# Patient Record
Sex: Male | Born: 1955
Health system: Southern US, Community
[De-identification: ages and names within clinical notes are randomized; demographics above are authoritative.]

## PROBLEM LIST (undated history)

## (undated) DIAGNOSIS — C44211 Basal cell carcinoma of skin of unspecified ear and external auricular canal: Secondary | ICD-10-CM

## (undated) DIAGNOSIS — Z87442 Personal history of urinary calculi: Secondary | ICD-10-CM

## (undated) DIAGNOSIS — J189 Pneumonia, unspecified organism: Secondary | ICD-10-CM

## (undated) DIAGNOSIS — E785 Hyperlipidemia, unspecified: Secondary | ICD-10-CM

## (undated) DIAGNOSIS — Z951 Presence of aortocoronary bypass graft: Secondary | ICD-10-CM

## (undated) DIAGNOSIS — I2510 Atherosclerotic heart disease of native coronary artery without angina pectoris: Secondary | ICD-10-CM

## (undated) HISTORY — DX: Basal cell carcinoma of skin of unspecified ear and external auricular canal: C44.211

## (undated) HISTORY — PX: CATARACT EXTRACTION: SUR2

## (undated) HISTORY — DX: Atherosclerotic heart disease of native coronary artery without angina pectoris: I25.10

## (undated) HISTORY — PX: HYDROCELE EXCISION: SHX482

## (undated) HISTORY — PX: MANDIBLE SURGERY: SHX707

## (undated) HISTORY — DX: Hyperlipidemia, unspecified: E78.5

## (undated) HISTORY — DX: Presence of aortocoronary bypass graft: Z95.1

---

## 1999-11-14 ENCOUNTER — Ambulatory Visit (HOSPITAL_BASED_OUTPATIENT_CLINIC_OR_DEPARTMENT_OTHER): Admission: RE | Admit: 1999-11-14 | Discharge: 1999-11-14 | Payer: Self-pay

## 2003-09-15 ENCOUNTER — Encounter: Admission: RE | Admit: 2003-09-15 | Discharge: 2003-09-15 | Payer: Self-pay | Admitting: Family Medicine

## 2012-07-13 ENCOUNTER — Other Ambulatory Visit: Payer: Self-pay | Admitting: Family Medicine

## 2012-07-13 DIAGNOSIS — G4482 Headache associated with sexual activity: Secondary | ICD-10-CM

## 2012-07-20 ENCOUNTER — Inpatient Hospital Stay: Admission: RE | Admit: 2012-07-20 | Payer: Self-pay | Source: Ambulatory Visit

## 2012-07-20 ENCOUNTER — Other Ambulatory Visit: Payer: Self-pay

## 2014-08-10 ENCOUNTER — Ambulatory Visit: Payer: Self-pay | Admitting: Cardiology

## 2014-08-24 ENCOUNTER — Encounter: Payer: Self-pay | Admitting: Cardiology

## 2015-03-28 ENCOUNTER — Encounter: Payer: Self-pay | Admitting: Cardiovascular Disease

## 2015-03-28 ENCOUNTER — Encounter: Payer: Self-pay | Admitting: Nurse Practitioner

## 2015-03-28 ENCOUNTER — Ambulatory Visit (INDEPENDENT_AMBULATORY_CARE_PROVIDER_SITE_OTHER): Payer: BLUE CROSS/BLUE SHIELD | Admitting: Cardiovascular Disease

## 2015-03-28 VITALS — BP 146/80 | HR 69 | Ht 73.0 in | Wt 206.0 lb

## 2015-03-28 DIAGNOSIS — I200 Unstable angina: Secondary | ICD-10-CM | POA: Diagnosis not present

## 2015-03-28 DIAGNOSIS — I209 Angina pectoris, unspecified: Secondary | ICD-10-CM | POA: Insufficient documentation

## 2015-03-28 DIAGNOSIS — I10 Essential (primary) hypertension: Secondary | ICD-10-CM | POA: Diagnosis not present

## 2015-03-28 DIAGNOSIS — E785 Hyperlipidemia, unspecified: Secondary | ICD-10-CM | POA: Diagnosis not present

## 2015-03-28 LAB — LIPID PANEL
CHOL/HDL RATIO: 5.6 ratio — AB (ref <=5.0)
CHOLESTEROL: 258 mg/dL — AB (ref 125–200)
HDL: 46 mg/dL (ref >=40)
LDL Cholesterol: 173 mg/dL — ABNORMAL HIGH (ref <130)
TRIGLYCERIDES: 195 mg/dL — AB (ref <150)
VLDL: 39 mg/dL — AB (ref <30)

## 2015-03-28 LAB — CBC WITH DIFFERENTIAL/PLATELET
Basophils Absolute: 0.1 10*3/uL (ref 0.0–0.1)
Basophils Relative: 1 % (ref 0–1)
Eosinophils Absolute: 0.1 10*3/uL (ref 0.0–0.7)
Eosinophils Relative: 2 % (ref 0–5)
HEMATOCRIT: 44.2 % (ref 39.0–52.0)
HEMOGLOBIN: 15.7 g/dL (ref 13.0–17.0)
LYMPHS PCT: 35 % (ref 12–46)
Lymphs Abs: 2.1 10*3/uL (ref 0.7–4.0)
MCH: 31.6 pg (ref 26.0–34.0)
MCHC: 35.5 g/dL (ref 30.0–36.0)
MCV: 88.9 fL (ref 78.0–100.0)
MONO ABS: 0.4 10*3/uL (ref 0.1–1.0)
MONOS PCT: 6 % (ref 3–12)
MPV: 10.3 fL (ref 8.6–12.4)
NEUTROS ABS: 3.4 10*3/uL (ref 1.7–7.7)
NEUTROS PCT: 56 % (ref 43–77)
Platelets: 181 10*3/uL (ref 150–400)
RBC: 4.97 MIL/uL (ref 4.22–5.81)
RDW: 13 % (ref 11.5–15.5)
WBC: 6.1 10*3/uL (ref 4.0–10.5)

## 2015-03-28 LAB — COMPREHENSIVE METABOLIC PANEL
ALBUMIN: 4.5 g/dL (ref 3.6–5.1)
ALK PHOS: 57 U/L (ref 40–115)
ALT: 15 U/L (ref 9–46)
AST: 14 U/L (ref 10–35)
BUN: 18 mg/dL (ref 7–25)
CALCIUM: 9.8 mg/dL (ref 8.6–10.3)
CO2: 27 mmol/L (ref 20–31)
Chloride: 101 mmol/L (ref 98–110)
Creat: 1.18 mg/dL (ref 0.70–1.33)
Glucose, Bld: 89 mg/dL (ref 65–99)
Potassium: 4.2 mmol/L (ref 3.5–5.3)
Sodium: 137 mmol/L (ref 135–146)
TOTAL PROTEIN: 7.4 g/dL (ref 6.1–8.1)
Total Bilirubin: 0.5 mg/dL (ref 0.2–1.2)

## 2015-03-28 MED ORDER — LOSARTAN POTASSIUM 50 MG PO TABS: 50.0000 mg | ORAL_TABLET | Freq: Every day | ORAL | Status: DC

## 2015-03-28 MED ORDER — ASPIRIN EC 81 MG PO TBEC: 81.0000 mg | DELAYED_RELEASE_TABLET | Freq: Every day | ORAL | Status: DC

## 2015-03-28 MED ORDER — NITROGLYCERIN 0.4 MG SL SUBL: 0.4000 mg | SUBLINGUAL_TABLET | SUBLINGUAL | Status: DC | PRN

## 2015-03-28 NOTE — Progress Notes (Signed)
Cardiology Office Note   Date:  03/28/2015   ID:  DIRON VONDERHEIDE, DOB February 14, 1956, MRN [id number]  PCP:  Simona Huh, MD  Cardiologist:   Thayer Headings, MD   Chief Complaint  Patient presents with  . Hyperlipidemia   Problem list 1. Hyperlipidemia   History of Present Illness: Kenneth Vance is a 59 y.o. male who presents for hyperlipidemia Has tried Zocor and Lipitor - had severe muscle aches. Also tried Livilo - same  Has not tried crestor .   Does not recall trying zetia  Recently has developed chest pain with any exertion. Has been walking. Wanted to work up to running again but cannot work up to running .  Develops mid sternal chest pain with brisk walking.  Pressure / tightness like feeling .    Resolves about 10-15 seconds after stopping  Occurs every time he walks quickly .  Had chest pain walking in from the parking lot today   Walks slowly without any difficulty .    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   Working in [employer].     Past Medical History  Diagnosis Date  . BCC (basal cell carcinoma), ear   . Hyperlipidemia     Past Surgical History  Procedure Laterality Date  . Mandible surgery    . Hydrocele excision    . Cataract extraction Left      Current Outpatient Prescriptions  Medication Sig Dispense Refill  . diazepam (VALIUM) 5 MG tablet Take 5 mg by mouth at bedtime as needed.   0  . ibuprofen (ADVIL,MOTRIN) 800 MG tablet Take 800 mg by mouth as needed for mild pain.   0   No current facility-administered medications for this visit.    Allergies:   Peanut-containing drug products; Fenofibrate; Lipitor; Livalo; Niacin; Niacin and related; Peanut oil; and Zocor    Social History:  The patient  reports that he has never smoked. He does not have any smokeless tobacco history on file. He reports that he drinks alcohol. He reports that he does not use illicit drugs.   Family History:  The  patient's family history includes Hyperlipidemia in his mother; Pulmonary fibrosis in his father.    ROS:  Please see the history of present illness.    Review of Systems: Constitutional:  denies fever, chills, diaphoresis, appetite change and fatigue.  HEENT: denies photophobia, eye pain, redness, hearing loss, ear pain, congestion, sore throat, rhinorrhea, sneezing, neck pain, neck stiffness and tinnitus.  Respiratory: denies SOB, DOE, cough, chest tightness, and wheezing.  Cardiovascular: admits to chest pain with exertion   Gastrointestinal: denies nausea, vomiting, abdominal pain, diarrhea, constipation, blood in stool.  Genitourinary: denies dysuria, urgency, frequency, hematuria, flank pain and difficulty urinating.  Musculoskeletal: denies  myalgias, back pain, joint swelling, arthralgias and gait problem.   Skin: denies pallor, rash and wound.  Neurological: denies dizziness, seizures, syncope, weakness, light-headedness, numbness and headaches.   Hematological: denies adenopathy, easy bruising, personal or family bleeding history.  Psychiatric/ Behavioral: denies suicidal ideation, mood changes, confusion, nervousness, sleep disturbance and agitation.       All other systems are reviewed and negative.    PHYSICAL EXAM: VS:  BP 146/80 mmHg  Pulse 69  Ht 6\' 1"  (1.854 m)  Wt 206 lb (93.441 kg)  BMI 27.18 kg/m2 , BMI Body mass index is 27.18 kg/(m^2). GEN: Well nourished, well developed, in no acute  distress HEENT: normal Neck: no JVD, carotid bruits, or masses Cardiac: RRR; no murmurs, rubs, or gallops,no edema  Respiratory:  clear to auscultation bilaterally, normal work of breathing GI: soft, nontender, nondistended, + BS MS: no deformity or atrophy Skin: warm and dry, no rash Neuro:  Strength and sensation are intact Psych: normal   EKG:  EKG is ordered today. The ekg ordered today demonstrates  NSR at 69.   Inc. RBBB   Recent Labs: No results found for  requested labs within last 365 days.    Lipid Panel No results found for: CHOL, TRIG, HDL, CHOLHDL, VLDL, LDLCALC, LDLDIRECT    Wt Readings from Last 3 Encounters:  03/28/15 206 lb (93.441 kg)      Other studies Reviewed: Additional studies/ records that were reviewed today include: . Review of the above records demonstrates:    ASSESSMENT AND PLAN:  1.  Unstable angina: Has predictable angina every time he walks at any CP. He can walk very slowly for several miles but deftly has chest tightness and pressure if he walks up an incline or picks up his paced. He's previously been very healthy and played in an adult soccer league until age 89.  His symptoms are completely consistent with unstable angina. We'll start him on aspirin once a day. We'll give him a prescription for nitroglycerin and I'll start him on losartan 50 mg a day. We'll schedule him for a cardiac catheterization and possible antiplastic in the next several days.  2. Essential hypertension: His blood pressure is mildly elevated. He eats a fairly good diet. We'll start him on losartan 50 mg a day.  3. Hyperlipidemia: He has a very strong family history of hyperlipidemia and has a family history of coronary artery disease. He's been intolerant to statins. We'll refer him to lipid clinic. I suspect that he'll need to be started on one of the injectable cholesterol medications.   Current medicines are reviewed at length with the patient today.  The patient does not have concerns regarding medicines.  The following changes have been made:  no change  Labs/ tests ordered today include:  No orders of the defined types were placed in this encounter.     Disposition:   FU with me 2 months .      Nahser, Wonda Cheng, MD  03/28/2015 9:23 AM    [address] [place], [place], [place]  [address] Phone: [phone]; Fax: [phone]   [facility]  [address] [place], [place]  [address] [phone]   Fax [phone]

## 2015-03-28 NOTE — Patient Instructions (Addendum)
Medication Instructions:  START Aspirin 81 mg once daily START Losartan 50 mg once daily TAKE Nitroglycerin 0.4 mg as needed for chest pain - place 1 pill under tongue, if pain persists may repeat 2 more times @ 5 minutes apart.  Do not exceed 3 tabs in one episode  Labwork: TODAY - cholesterol, basic metabolic panel, liver, pt/inr, CBC  Testing/Procedures: Your physician has requested that you have a cardiac catheterization. Cardiac catheterization is used to diagnose and/or treat various heart conditions. Doctors may recommend this procedure for a number of different reasons. The most common reason is to evaluate chest pain. Chest pain can be a symptom of coronary artery disease (CAD), and cardiac catheterization can show whether plaque is narrowing or blocking your heart's arteries. This procedure is also used to evaluate the valves, as well as measure the blood flow and oxygen levels in different parts of your heart. For further information please visit [email or web address]. Please follow instruction sheet, as given.    Follow-Up: You have been referred to Lipid Clinic for management of your high cholesterol   Your physician recommends that you schedule a follow-up appointment in: 2 months with Dr. Acie Fredrickson.    If you need a refill on your cardiac medications before your next appointment, please call your pharmacy.   Thank you for choosing [facility]! Christen Bame, RN [phone]

## 2015-03-29 ENCOUNTER — Encounter (HOSPITAL_COMMUNITY)
Admission: AD | Disposition: A | Discharge status: Home / Self Care | Payer: Self-pay | Source: Ambulatory Visit | Attending: Surgery

## 2015-03-29 ENCOUNTER — Encounter (HOSPITAL_COMMUNITY): Payer: Self-pay | Admitting: *Deleted

## 2015-03-29 ENCOUNTER — Inpatient Hospital Stay (HOSPITAL_COMMUNITY)
Admission: AD | Admit: 2015-03-29 | Discharge: 2015-04-05 | DRG: 234 | Disposition: A | Discharge status: Home / Self Care | Payer: BLUE CROSS/BLUE SHIELD | Source: Ambulatory Visit | Attending: Surgery | Admitting: Surgery

## 2015-03-29 ENCOUNTER — Other Ambulatory Visit: Payer: Self-pay | Admitting: *Deleted

## 2015-03-29 DIAGNOSIS — Z951 Presence of aortocoronary bypass graft: Secondary | ICD-10-CM

## 2015-03-29 DIAGNOSIS — D6959 Other secondary thrombocytopenia: Secondary | ICD-10-CM | POA: Diagnosis not present

## 2015-03-29 DIAGNOSIS — E8770 Fluid overload, unspecified: Secondary | ICD-10-CM | POA: Diagnosis not present

## 2015-03-29 DIAGNOSIS — I25119 Atherosclerotic heart disease of native coronary artery with unspecified angina pectoris: Secondary | ICD-10-CM

## 2015-03-29 DIAGNOSIS — Z79899 Other long term (current) drug therapy: Secondary | ICD-10-CM

## 2015-03-29 DIAGNOSIS — I25110 Atherosclerotic heart disease of native coronary artery with unstable angina pectoris: Principal | ICD-10-CM | POA: Diagnosis present

## 2015-03-29 DIAGNOSIS — I10 Essential (primary) hypertension: Secondary | ICD-10-CM | POA: Diagnosis present

## 2015-03-29 DIAGNOSIS — I2510 Atherosclerotic heart disease of native coronary artery without angina pectoris: Secondary | ICD-10-CM

## 2015-03-29 DIAGNOSIS — Z7982 Long term (current) use of aspirin: Secondary | ICD-10-CM

## 2015-03-29 DIAGNOSIS — K5900 Constipation, unspecified: Secondary | ICD-10-CM | POA: Diagnosis not present

## 2015-03-29 DIAGNOSIS — Z8249 Family history of ischemic heart disease and other diseases of the circulatory system: Secondary | ICD-10-CM | POA: Diagnosis not present

## 2015-03-29 DIAGNOSIS — J9811 Atelectasis: Secondary | ICD-10-CM

## 2015-03-29 DIAGNOSIS — Z85828 Personal history of other malignant neoplasm of skin: Secondary | ICD-10-CM | POA: Diagnosis not present

## 2015-03-29 DIAGNOSIS — E785 Hyperlipidemia, unspecified: Secondary | ICD-10-CM | POA: Diagnosis present

## 2015-03-29 DIAGNOSIS — R079 Chest pain, unspecified: Secondary | ICD-10-CM | POA: Diagnosis present

## 2015-03-29 DIAGNOSIS — I200 Unstable angina: Secondary | ICD-10-CM

## 2015-03-29 DIAGNOSIS — I209 Angina pectoris, unspecified: Secondary | ICD-10-CM | POA: Diagnosis not present

## 2015-03-29 HISTORY — PX: CARDIAC CATHETERIZATION: SHX172

## 2015-03-29 HISTORY — DX: Atherosclerotic heart disease of native coronary artery without angina pectoris: I25.10

## 2015-03-29 LAB — CBC
HEMATOCRIT: 42.1 % (ref 39.0–52.0)
Hemoglobin: 14.2 g/dL (ref 13.0–17.0)
MCH: 30.1 pg (ref 26.0–34.0)
MCHC: 33.7 g/dL (ref 30.0–36.0)
MCV: 89.4 fL (ref 78.0–100.0)
PLATELETS: 160 10*3/uL (ref 150–400)
RBC: 4.71 MIL/uL (ref 4.22–5.81)
RDW: 12.4 % (ref 11.5–15.5)
WBC: 7 10*3/uL (ref 4.0–10.5)

## 2015-03-29 LAB — SURGICAL PCR SCREEN
MRSA, PCR: NEGATIVE
Staphylococcus aureus: NEGATIVE

## 2015-03-29 LAB — CREATININE, SERUM
Creatinine, Ser: 1.01 mg/dL (ref 0.61–1.24)
GFR calc Af Amer: >60 mL/min (ref >60)

## 2015-03-29 LAB — PROTIME-INR
INR: 0.91 (ref <1.50)
Prothrombin Time: 12.3 seconds (ref 11.6–15.2)

## 2015-03-29 SURGERY — LEFT HEART CATH AND CORONARY ANGIOGRAPHY

## 2015-03-29 MED ORDER — SODIUM CHLORIDE 0.9 % IJ SOLN
3.0000 mL | INTRAMUSCULAR | Status: DC | PRN
Start: 2015-03-29 — End: 2015-03-29

## 2015-03-29 MED ORDER — SODIUM CHLORIDE 0.9 % WEIGHT BASED INFUSION: 1.0000 mL/kg/h | INTRAVENOUS | Status: AC

## 2015-03-29 MED ORDER — SODIUM CHLORIDE 0.9 % IV SOLN: 250.0000 mL | INTRAVENOUS | Status: DC | PRN

## 2015-03-29 MED ORDER — SODIUM CHLORIDE 0.9 % IJ SOLN: 3.0000 mL | INTRAMUSCULAR | Status: DC | PRN

## 2015-03-29 MED ORDER — ASPIRIN EC 81 MG PO TBEC
81.0000 mg | DELAYED_RELEASE_TABLET | Freq: Every day | ORAL | Status: DC
  Administered 2015-03-30: 81 mg via ORAL
  Filled 2015-03-29: qty 1

## 2015-03-29 MED ORDER — SODIUM CHLORIDE 0.9 % IJ SOLN: 3.0000 mL | Freq: Two times a day (BID) | INTRAMUSCULAR | Status: DC

## 2015-03-29 MED ORDER — HEPARIN SODIUM (PORCINE) 1000 UNIT/ML IJ SOLN
INTRAMUSCULAR | Status: AC
  Filled 2015-03-29: qty 1

## 2015-03-29 MED ORDER — SODIUM CHLORIDE 0.9 % WEIGHT BASED INFUSION
3.0000 mL/kg/h | INTRAVENOUS | Status: DC
  Administered 2015-03-29: 3 mL/kg/h via INTRAVENOUS

## 2015-03-29 MED ORDER — NITROGLYCERIN 0.4 MG SL SUBL: 0.4000 mg | SUBLINGUAL_TABLET | SUBLINGUAL | Status: DC | PRN

## 2015-03-29 MED ORDER — ASPIRIN 81 MG PO CHEW: 81.0000 mg | CHEWABLE_TABLET | ORAL | Status: DC

## 2015-03-29 MED ORDER — MIDAZOLAM HCL 2 MG/2ML IJ SOLN
INTRAMUSCULAR | Status: AC
  Filled 2015-03-29: qty 2

## 2015-03-29 MED ORDER — LOSARTAN POTASSIUM 50 MG PO TABS: 50.0000 mg | ORAL_TABLET | Freq: Every day | ORAL | Status: DC

## 2015-03-29 MED ORDER — IOHEXOL 350 MG/ML SOLN
INTRAVENOUS | Status: DC | PRN
  Administered 2015-03-29: 140 mL via INTRACARDIAC

## 2015-03-29 MED ORDER — HEPARIN SODIUM (PORCINE) 1000 UNIT/ML IJ SOLN
INTRAMUSCULAR | Status: DC | PRN
  Administered 2015-03-29: 5000 [IU] via INTRAVENOUS

## 2015-03-29 MED ORDER — SODIUM CHLORIDE 0.9 % IJ SOLN
3.0000 mL | Freq: Two times a day (BID) | INTRAMUSCULAR | Status: DC
  Administered 2015-03-29: 3 mL via INTRAVENOUS

## 2015-03-29 MED ORDER — HEPARIN SODIUM (PORCINE) 5000 UNIT/ML IJ SOLN
5000.0000 [IU] | Freq: Three times a day (TID) | INTRAMUSCULAR | Status: DC
  Administered 2015-03-30 (×3): 5000 [IU] via SUBCUTANEOUS
  Filled 2015-03-29 (×3): qty 1

## 2015-03-29 MED ORDER — ONDANSETRON HCL 4 MG/2ML IJ SOLN: 4.0000 mg | Freq: Four times a day (QID) | INTRAMUSCULAR | Status: DC | PRN

## 2015-03-29 MED ORDER — ASPIRIN 81 MG PO CHEW
CHEWABLE_TABLET | ORAL | Status: AC
  Filled 2015-03-29: qty 1

## 2015-03-29 MED ORDER — VERAPAMIL HCL 2.5 MG/ML IV SOLN
INTRAVENOUS | Status: AC
  Filled 2015-03-29: qty 2

## 2015-03-29 MED ORDER — SODIUM CHLORIDE 0.9 % IJ SOLN
3.0000 mL | Freq: Two times a day (BID) | INTRAMUSCULAR | Status: DC
  Administered 2015-03-30: 3 mL via INTRAVENOUS

## 2015-03-29 MED ORDER — HEPARIN (PORCINE) IN NACL 2-0.9 UNIT/ML-% IJ SOLN
INTRAMUSCULAR | Status: AC
  Filled 2015-03-29: qty 1000

## 2015-03-29 MED ORDER — VERAPAMIL HCL 2.5 MG/ML IV SOLN
INTRAVENOUS | Status: DC | PRN
  Administered 2015-03-29: 12:00:00 via INTRA_ARTERIAL

## 2015-03-29 MED ORDER — ACETAMINOPHEN 325 MG PO TABS: 650.0000 mg | ORAL_TABLET | ORAL | Status: DC | PRN

## 2015-03-29 MED ORDER — LIDOCAINE HCL (PF) 1 % IJ SOLN
INTRAMUSCULAR | Status: DC | PRN
  Administered 2015-03-29: 12:00:00

## 2015-03-29 MED ORDER — ASPIRIN 81 MG PO CHEW
81.0000 mg | CHEWABLE_TABLET | ORAL | Status: AC
  Administered 2015-03-29: 81 mg via ORAL

## 2015-03-29 MED ORDER — FENTANYL CITRATE (PF) 100 MCG/2ML IJ SOLN
INTRAMUSCULAR | Status: AC
  Filled 2015-03-29: qty 2

## 2015-03-29 MED ORDER — SODIUM CHLORIDE 0.9 % WEIGHT BASED INFUSION: 1.0000 mL/kg/h | INTRAVENOUS | Status: DC

## 2015-03-29 MED ORDER — FENTANYL CITRATE (PF) 100 MCG/2ML IJ SOLN
INTRAMUSCULAR | Status: DC | PRN
  Administered 2015-03-29: 25 ug via INTRAVENOUS

## 2015-03-29 MED ORDER — SODIUM CHLORIDE 0.9 % WEIGHT BASED INFUSION: 3.0000 mL/kg/h | INTRAVENOUS | Status: AC

## 2015-03-29 MED ORDER — MIDAZOLAM HCL 2 MG/2ML IJ SOLN
INTRAMUSCULAR | Status: DC | PRN
  Administered 2015-03-29: 2 mg via INTRAVENOUS

## 2015-03-29 MED ORDER — LIDOCAINE HCL (PF) 1 % IJ SOLN
INTRAMUSCULAR | Status: AC
  Filled 2015-03-29: qty 30

## 2015-03-29 SURGICAL SUPPLY — 13 items
CATH INFINITI 5 FR 3DRC (CATHETERS) ×3 IMPLANT
CATH INFINITI 5 FR JL3.5 (CATHETERS) ×3 IMPLANT
CATH INFINITI 5FR AL1 (CATHETERS) ×3 IMPLANT
CATH INFINITI 5FR ANG PIGTAIL (CATHETERS) ×3 IMPLANT
CATH INFINITI JR4 5F (CATHETERS) ×3 IMPLANT
DEVICE RAD COMP TR BAND LRG (VASCULAR PRODUCTS) ×3 IMPLANT
GLIDESHEATH SLEND SS 6F .021 (SHEATH) ×3 IMPLANT
KIT HEART LEFT (KITS) ×3 IMPLANT
PACK CARDIAC CATHETERIZATION (CUSTOM PROCEDURE TRAY) ×3 IMPLANT
SYR MEDRAD MARK V 150ML (SYRINGE) ×3 IMPLANT
TRANSDUCER W/STOPCOCK (MISCELLANEOUS) ×3 IMPLANT
TUBING CIL FLEX 10 FLL-RA (TUBING) ×3 IMPLANT
WIRE SAFE-T 1.5MM-J .035X260CM (WIRE) ×3 IMPLANT

## 2015-03-29 NOTE — H&P (View-Only) (Signed)
Cardiology Office Note   Date:  03/28/2015   ID:  STANLEY MERLE, DOB 1956-04-26, MRN [id number]  PCP:  Simona Huh, MD  Cardiologist:   Thayer Headings, MD   Chief Complaint  Patient presents with  . Hyperlipidemia   Problem list 1. Hyperlipidemia   History of Present Illness: Kenneth Vance is a 59 y.o. male who presents for hyperlipidemia Has tried Zocor and Lipitor - had severe muscle aches. Also tried Livilo - same  Has not tried crestor .   Does not recall trying zetia  Recently has developed chest pain with any exertion. Has been walking. Wanted to work up to running again but cannot work up to running .  Develops mid sternal chest pain with brisk walking.  Pressure / tightness like feeling .    Resolves about 10-15 seconds after stopping  Occurs every time he walks quickly .  Had chest pain walking in from the parking lot today   Walks slowly without any difficulty .    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   Working in [employer].     Past Medical History  Diagnosis Date  . BCC (basal cell carcinoma), ear   . Hyperlipidemia     Past Surgical History  Procedure Laterality Date  . Mandible surgery    . Hydrocele excision    . Cataract extraction Left      Current Outpatient Prescriptions  Medication Sig Dispense Refill  . diazepam (VALIUM) 5 MG tablet Take 5 mg by mouth at bedtime as needed.   0  . ibuprofen (ADVIL,MOTRIN) 800 MG tablet Take 800 mg by mouth as needed for mild pain.   0   No current facility-administered medications for this visit.    Allergies:   Peanut-containing drug products; Fenofibrate; Lipitor; Livalo; Niacin; Niacin and related; Peanut oil; and Zocor    Social History:  The patient  reports that he has never smoked. He does not have any smokeless tobacco history on file. He reports that he drinks alcohol. He reports that he does not use illicit drugs.   Family History:  The  patient's family history includes Hyperlipidemia in his mother; Pulmonary fibrosis in his father.    ROS:  Please see the history of present illness.    Review of Systems: Constitutional:  denies fever, chills, diaphoresis, appetite change and fatigue.  HEENT: denies photophobia, eye pain, redness, hearing loss, ear pain, congestion, sore throat, rhinorrhea, sneezing, neck pain, neck stiffness and tinnitus.  Respiratory: denies SOB, DOE, cough, chest tightness, and wheezing.  Cardiovascular: admits to chest pain with exertion   Gastrointestinal: denies nausea, vomiting, abdominal pain, diarrhea, constipation, blood in stool.  Genitourinary: denies dysuria, urgency, frequency, hematuria, flank pain and difficulty urinating.  Musculoskeletal: denies  myalgias, back pain, joint swelling, arthralgias and gait problem.   Skin: denies pallor, rash and wound.  Neurological: denies dizziness, seizures, syncope, weakness, light-headedness, numbness and headaches.   Hematological: denies adenopathy, easy bruising, personal or family bleeding history.  Psychiatric/ Behavioral: denies suicidal ideation, mood changes, confusion, nervousness, sleep disturbance and agitation.       All other systems are reviewed and negative.    PHYSICAL EXAM: VS:  BP 146/80 mmHg  Pulse 69  Ht 6\' 1"  (1.854 m)  Wt 206 lb (93.441 kg)  BMI 27.18 kg/m2 , BMI Body mass index is 27.18 kg/(m^2). GEN: Well nourished, well developed, in no acute  distress HEENT: normal Neck: no JVD, carotid bruits, or masses Cardiac: RRR; no murmurs, rubs, or gallops,no edema  Respiratory:  clear to auscultation bilaterally, normal work of breathing GI: soft, nontender, nondistended, + BS MS: no deformity or atrophy Skin: warm and dry, no rash Neuro:  Strength and sensation are intact Psych: normal   EKG:  EKG is ordered today. The ekg ordered today demonstrates  NSR at 69.   Inc. RBBB   Recent Labs: No results found for  requested labs within last 365 days.    Lipid Panel No results found for: CHOL, TRIG, HDL, CHOLHDL, VLDL, LDLCALC, LDLDIRECT    Wt Readings from Last 3 Encounters:  03/28/15 206 lb (93.441 kg)      Other studies Reviewed: Additional studies/ records that were reviewed today include: . Review of the above records demonstrates:    ASSESSMENT AND PLAN:  1.  Unstable angina: Has predictable angina every time he walks at any CP. He can walk very slowly for several miles but deftly has chest tightness and pressure if he walks up an incline or picks up his paced. He's previously been very healthy and played in an adult soccer league until age 52.  His symptoms are completely consistent with unstable angina. We'll start him on aspirin once a day. We'll give him a prescription for nitroglycerin and I'll start him on losartan 50 mg a day. We'll schedule him for a cardiac catheterization and possible antiplastic in the next several days.  2. Essential hypertension: His blood pressure is mildly elevated. He eats a fairly good diet. We'll start him on losartan 50 mg a day.  3. Hyperlipidemia: He has a very strong family history of hyperlipidemia and has a family history of coronary artery disease. He's been intolerant to statins. We'll refer him to lipid clinic. I suspect that he'll need to be started on one of the injectable cholesterol medications.   Current medicines are reviewed at length with the patient today.  The patient does not have concerns regarding medicines.  The following changes have been made:  no change  Labs/ tests ordered today include:  No orders of the defined types were placed in this encounter.     Disposition:   FU with me 2 months .      Nahser, Wonda Cheng, MD  03/28/2015 9:23 AM    [address] [place], [place], [place]  [address] Phone: [phone]; Fax: [phone]   [facility]  [address] [place], [place]  [address] [phone]   Fax [phone]

## 2015-03-29 NOTE — Consult Note (Signed)
[address]       [place] [address]             [phone]      Cardiothoracic Surgery Consultation  Reason for Consult: severe multi-vessel coronary artery disease Referring Physician: Dr. Grayland Jack PCP: Gaynelle Arabian, MD  Kenneth Vance is an 59 y.o. male.  HPI:   The patient is a 60 year old gentleman with hyperlipidemia who has not tolerated any medications for this and a strong family history of coronary artery disease on both sides of his family who reports about a one month history of exertional chest discomfort. This has only occurred with significant exertion like brisk walks or going up hills or stairs. He has been walking more trying to get back in shape but has noticed this chest discomfort. Sometimes he feels like he can walk through it and has had periods where he feels stronger with more stamina but then it recurs. Cath today shows severe multi-vessel coronary disease with a long 90% ostial to proximal LAD stenosis. The LCX has a small ramus branch with 90% stenosis and the OM1 has a 90% stenosis. The RPDA has a 90% stenosis. LV function is normal.  He lives in [place] with his wife. 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   Past Medical History  Diagnosis Date  . BCC (basal cell carcinoma), ear   . Hyperlipidemia     Past Surgical History  Procedure Laterality Date  . Mandible surgery    . Hydrocele excision    . Cataract extraction Left     Family History  Problem Relation Age of Onset  . Hyperlipidemia Mother   . Pulmonary fibrosis Father     Social History:  reports that he has never smoked. He does not have any smokeless tobacco history on file. He reports that he drinks alcohol. He reports that he does not use illicit drugs.  Allergies:  Allergies  Allergen Reactions  . Peanut-Containing Drug Products   . Fenofibrate   . Lipitor [Atorvastatin] Other (See Comments)    myalgia  . Livalo  [Pitavastatin] Other (See Comments)    myalgia  . Niacin Other (See Comments)  . Niacin And Related Other (See Comments)    Flushing, feels weak   . Peanut Oil Other (See Comments)  . Zocor [Simvastatin] Other (See Comments)    myalgia    Medications:  I have reviewed the patient's current medications. Prior to Admission:  Prescriptions prior to admission  Medication Sig Dispense Refill Last Dose  . aspirin EC 81 MG tablet Take 1 tablet (81 mg total) by mouth daily.   03/29/2015 at 0730  . diazepam (VALIUM) 5 MG tablet Take 5 mg by mouth at bedtime as needed.   0 03/27/2015  . ibuprofen (ADVIL,MOTRIN) 800 MG tablet Take 800 mg by mouth as needed for mild pain.   0 Past Week at Unknown time  . nitroGLYCERIN (NITROSTAT) 0.4 MG SL tablet Place 1 tablet (0.4 mg total) under the tongue every 5 (five) minutes as needed for chest pain. 25 tablet 6 unkn  . losartan (COZAAR) 50 MG tablet Take 1 tablet (50 mg total) by mouth daily. 31 tablet 11    Scheduled: . [START ON 03/30/2015] aspirin  81 mg Oral Pre-Cath  . aspirin EC  81 mg Oral Daily  . heparin  5,000 Units Subcutaneous 3 times per day  . losartan  50 mg  Oral Daily  . sodium chloride  3 mL Intravenous Q12H  . sodium chloride  3 mL Intravenous Q12H   Continuous: . [START ON 03/30/2015] sodium chloride     Followed by  . [START ON 03/30/2015] sodium chloride     UUV:OZDGUY chloride, sodium chloride, acetaminophen, nitroGLYCERIN, ondansetron (ZOFRAN) IV, sodium chloride, sodium chloride Anti-infectives    None      Results for orders placed or performed during the hospital encounter of 03/29/15 (from the past 48 hour(s))  CBC     Status: None   Collection Time: 03/29/15  6:14 PM  Result Value Ref Range   WBC 7.0 4.0 - 10.5 K/uL   RBC 4.71 4.22 - 5.81 MIL/uL   Hemoglobin 14.2 13.0 - 17.0 g/dL   HCT 42.1 39.0 - 52.0 %   MCV 89.4 78.0 - 100.0 fL   MCH 30.1 26.0 - 34.0 pg   MCHC 33.7 30.0 - 36.0 g/dL   RDW 12.4 11.5 - 15.5 %    Platelets 160 150 - 400 K/uL  Creatinine, serum     Status: None   Collection Time: 03/29/15  6:14 PM  Result Value Ref Range   Creatinine, Ser 1.01 0.61 - 1.24 mg/dL   GFR calc non Af Amer >60 >60 mL/min   GFR calc Af Amer >60 >60 mL/min    Comment: (NOTE) The eGFR has been calculated using the CKD EPI equation. This calculation has not been validated in all clinical situations. eGFR's persistently <60 mL/min signify possible Chronic Kidney Disease.     No results found.  Review of Systems  Constitutional: Positive for malaise/fatigue. Negative for fever, chills, weight loss and diaphoresis.  HENT: Negative.   Eyes: Negative.   Respiratory: Negative for shortness of breath.   Cardiovascular: Positive for chest pain. Negative for palpitations, leg swelling and PND.  Gastrointestinal: Negative.   Genitourinary: Negative.   Musculoskeletal: Negative.   Skin: Negative.   Neurological: Negative.  Negative for weakness.  Endo/Heme/Allergies: Negative.   Psychiatric/Behavioral: Negative.    Blood pressure 126/65, pulse 66, temperature 97.7 F (36.5 C), temperature source Oral, resp. rate 20, height _0  (1.854 m), weight 94.348 kg (208 lb), SpO2 99 %. Physical Exam  Constitutional: He is oriented to person, place, and time. He appears well-developed and well-nourished. No distress.  HENT:  Head: Normocephalic and atraumatic.  Mouth/Throat: Oropharynx is clear and moist.  Eyes: EOM are normal. Pupils are equal, round, and reactive to light.  Neck: Normal range of motion. Neck supple. No tracheal deviation present. No thyromegaly present.  Cardiovascular: Normal rate, regular rhythm, normal heart sounds and intact distal pulses.   No murmur heard. Respiratory: Effort normal and breath sounds normal. No respiratory distress. He has no wheezes. He has no rales.  GI: Soft. Bowel sounds are normal. He exhibits no distension. There is no tenderness.  Musculoskeletal: Normal range of  motion. He exhibits no edema.  Lymphadenopathy:    He has no cervical adenopathy.  Neurological: He is alert and oriented to person, place, and time. He has normal strength. No cranial nerve deficit or sensory deficit.  Skin: Skin is warm and dry.  Psychiatric: He has a normal mood and affect.   Jettie Booze, MD (Primary)      Procedures    Left Heart Cath and Coronary Angiography    Conclusion     Ost RPDA to RPDA lesion, 90% stenosed. High bifurcation of PDA and PLA. PLA is free of severe disease.  Ost Ramus lesion, 90% stenosed.  Ost 1st Mrg to 1st Mrg lesion, 90% stenosed.  Ost LAD to Prox LAD lesion, 90% stenosed.  The left ventricular systolic function is normal.  Severe three vessel disease which would be best treated with CABG. Will admit the patient. He will need consideration for PCSK9 inhibitor in the future due to his statin intolerance.     Indications    Angina pectoris ([place]) [I20.9 (ICD-10-CM)]    Technique and Indications    The risks, benefits, and details of the procedure were explained to the patient. The patient verbalized understanding and wanted to proceed. Informed written consent was obtained.  PROCEDURE TECHNIQUE: After Xylocaine anesthesia a 9F slender sheath was placed in the right radial artery with a single anterior needle wall stick. IV Heparin was given. Right coronary angiography was done using a AL1 guide catheter. Left coronary angiography was done using a Judkins L3.5 guide catheter. Left ventriculography was done using a pigtail catheter. A TR band was used for hemostasis.  Contrast: 140 cc   Estimated blood loss <50 mL. There were no immediate complications during the procedure.    Coronary Findings    Dominance: Right   Left Anterior Descending   . Ost LAD to Prox LAD lesion, 90% stenosed.   Jorene Minors LAD lesion, 40% stenosed.   . First Diagonal Branch   The vessel is small in size.   Colon Flattery 1st Diag to 1st Diag  lesion, 90% stenosed.     Ramus Intermedius  . Vessel is moderate in size.   Colon Flattery Ramus lesion, 90% stenosed.     Left Circumflex   . Ost Cx to Prox Cx lesion, 40% stenosed.   . First Obtuse Marginal Branch   . Ost 1st Mrg to 1st Mrg lesion, 90% stenosed.     Right Coronary Artery  Downward takeoff requiring AL1. Multipurpose would likely fit the best. High bifurcation.   . Right Posterior Descending Artery   . Ost RPDA lesion, 50% stenosed.   Colon Flattery RPDA to RPDA lesion, 90% stenosed.      Wall Motion                 Left Heart    Left Ventricle The left ventricular size is normal. The left ventricular systolic function is normal. The left ventricular ejection fraction is 55-65% by visual estimate. There are no wall motion abnormalities in the left ventricle.   Aortic Valve There is no aortic valve stenosis.    Coronary Diagrams    Diagnostic Diagram            Implants    Name ID Temporary Type Supply   No information to display    PACS Images    Show images for Cardiac catheterization     Link to Procedure Log    Procedure Log      Hemo Data       Most Recent Value   AO Systolic Pressure  035 mmHg   AO Diastolic Pressure  78 mmHg   AO Mean  465 mmHg   LV Systolic Pressure  681 mmHg   LV Diastolic Pressure  1 mmHg   LV EDP  18 mmHg   Arterial Occlusion Pressure Extended Systolic Pressure  275 mmHg   Arterial Occlusion Pressure Extended Diastolic Pressure  73 mmHg   Arterial Occlusion Pressure Extended Mean Pressure  99 mmHg   Left Ventricular Apex Extended Systolic Pressure  170 mmHg  Left Ventricular Apex Extended Diastolic Pressure  5 mmHg   Left Ventricular Apex Extended EDP Pressure  18 mmHg    Assessment/Plan:  He has severe multi-vessel coronary disease with progressive exertional angina. I agree that CABG is the best treatment for this patient. I discussed the operative procedure with the patient  including  alternatives, benefits and risks; including but not limited to bleeding, blood transfusion, infection, stroke, myocardial infarction, graft failure, heart block requiring a permanent pacemaker, organ dysfunction, and death.  Kenneth Vance understands and agrees to proceed.  We will schedule surgery for Friday morning.  Gaye Pollack 03/29/2015, 7:47 PM

## 2015-03-29 NOTE — Interval H&P Note (Signed)
Cath Lab Visit (complete for each Cath Lab visit)  Clinical Evaluation Leading to the Procedure:   ACS: No.  Non-ACS:    Anginal Classification: CCS IV  Anti-ischemic medical therapy: Minimal Therapy (1 class of medications)  Non-Invasive Test Results: No non-invasive testing performed  Prior CABG: No previous CABG  Accelerating angina    History and Physical Interval Note:  03/29/2015 11:00 AM  Kenneth Vance  has presented today for surgery, with the diagnosis of unstable angina  The various methods of treatment have been discussed with the patient and family. After consideration of risks, benefits and other options for treatment, the patient has consented to  Procedure(s): Left Heart Cath and Coronary Angiography (N/A) as a surgical intervention .  The patient's history has been reviewed, patient examined, no change in status, stable for surgery.  I have reviewed the patient's chart and labs.  Questions were answered to the patient's satisfaction.     VARANASI,JAYADEEP S.

## 2015-03-29 NOTE — Addendum Note (Signed)
Addended by: Thayer Headings on: 03/29/2015 10:53 AM   Modules accepted: Orders

## 2015-03-29 NOTE — Research (Signed)
[place] Study Informed Consent   Subject Name: Kenneth Vance  Subject met inclusion and exclusion criteria.  The informed consent form, study requirements and expectations were reviewed with the subject and questions and concerns were addressed prior to the signing of the consent form.  The subject verbalized understanding of the trail requirements.  The subject agreed to participate in the [place] trial and signed the informed consent.  The informed consent was obtained prior to performance of any protocol-specific procedures for the subject.  A copy of the signed informed consent was given to the subject and a copy was placed in the subject's medical record.  Kenneth Vance 03/29/2015, 10:54 AM

## 2015-03-30 ENCOUNTER — Inpatient Hospital Stay (HOSPITAL_COMMUNITY): Payer: BLUE CROSS/BLUE SHIELD

## 2015-03-30 ENCOUNTER — Encounter (HOSPITAL_COMMUNITY): Payer: Self-pay | Admitting: Interventional Cardiology

## 2015-03-30 DIAGNOSIS — I25110 Atherosclerotic heart disease of native coronary artery with unstable angina pectoris: Secondary | ICD-10-CM

## 2015-03-30 DIAGNOSIS — I2510 Atherosclerotic heart disease of native coronary artery without angina pectoris: Secondary | ICD-10-CM | POA: Diagnosis present

## 2015-03-30 DIAGNOSIS — E785 Hyperlipidemia, unspecified: Secondary | ICD-10-CM

## 2015-03-30 DIAGNOSIS — I10 Essential (primary) hypertension: Secondary | ICD-10-CM

## 2015-03-30 LAB — POCT I-STAT 3, ART BLOOD GAS (G3+)
ACID-BASE EXCESS: 1 mmol/L (ref 0.0–2.0)
BICARBONATE: 25.7 meq/L — AB (ref 20.0–24.0)
O2 Saturation: 92 %
PCO2 ART: 40 mmHg (ref 35.0–45.0)
PO2 ART: 63 mmHg — AB (ref 80.0–100.0)
Patient temperature: 98.6
TCO2: 27 mmol/L (ref 0–100)
pH, Arterial: 7.416 (ref 7.350–7.450)

## 2015-03-30 LAB — URINALYSIS, ROUTINE W REFLEX MICROSCOPIC
BILIRUBIN URINE: NEGATIVE
Glucose, UA: NEGATIVE mg/dL
Hgb urine dipstick: NEGATIVE
Ketones, ur: NEGATIVE mg/dL
Leukocytes, UA: NEGATIVE
NITRITE: NEGATIVE
PH: 5 (ref 5.0–8.0)
Protein, ur: NEGATIVE mg/dL
SPECIFIC GRAVITY, URINE: 1.02 (ref 1.005–1.030)

## 2015-03-30 LAB — PULMONARY FUNCTION TEST
FEF 25-75 PRE: 4.32 L/s
FEF 25-75 Post: 4.83 L/sec
FEF2575-%CHANGE-POST: 11 %
FEF2575-%PRED-POST: 143 %
FEF2575-%PRED-PRE: 128 %
FEV1-%Change-Post: 3 %
FEV1-%PRED-POST: 102 %
FEV1-%Pred-Pre: 99 %
FEV1-Post: 4.19 L
FEV1-Pre: 4.06 L
FEV1FVC-%CHANGE-POST: 3 %
FEV1FVC-%Pred-Pre: 106 %
FEV6-%CHANGE-POST: 0 %
FEV6-%Pred-Post: 96 %
FEV6-%Pred-Pre: 96 %
FEV6-PRE: 4.97 L
FEV6-Post: 4.97 L
FEV6FVC-%Change-Post: 0 %
FEV6FVC-%PRED-PRE: 103 %
FEV6FVC-%Pred-Post: 104 %
FVC-%Change-Post: 0 %
FVC-%PRED-PRE: 93 %
FVC-%Pred-Post: 92 %
FVC-POST: 4.97 L
FVC-PRE: 4.99 L
POST FEV1/FVC RATIO: 84 %
PRE FEV1/FVC RATIO: 81 %
Post FEV6/FVC ratio: 100 %
Pre FEV6/FVC Ratio: 100 %

## 2015-03-30 LAB — TYPE AND SCREEN
ABO/RH(D): O NEG
Antibody Screen: NEGATIVE

## 2015-03-30 LAB — ABO/RH: ABO/RH(D): O NEG

## 2015-03-30 MED ORDER — TEMAZEPAM 15 MG PO CAPS: 15.0000 mg | ORAL_CAPSULE | Freq: Once | ORAL | Status: DC | PRN

## 2015-03-30 MED ORDER — CHLORHEXIDINE GLUCONATE 0.12 % MT SOLN
15.0000 mL | Freq: Once | OROMUCOSAL | Status: AC
  Administered 2015-03-31: 15 mL via OROMUCOSAL
  Filled 2015-03-30: qty 15

## 2015-03-30 MED ORDER — EPINEPHRINE HCL 1 MG/ML IJ SOLN
0.0000 ug/min | INTRAVENOUS | Status: DC
  Filled 2015-03-30: qty 4

## 2015-03-30 MED ORDER — BISACODYL 5 MG PO TBEC
5.0000 mg | DELAYED_RELEASE_TABLET | Freq: Once | ORAL | Status: AC
  Administered 2015-03-30: 5 mg via ORAL
  Filled 2015-03-30: qty 1

## 2015-03-30 MED ORDER — CHLORHEXIDINE GLUCONATE 4 % EX LIQD
CUTANEOUS | Status: AC
  Administered 2015-03-30: 21:00:00
  Filled 2015-03-30: qty 120

## 2015-03-30 MED ORDER — POTASSIUM CHLORIDE 2 MEQ/ML IV SOLN
80.0000 meq | INTRAVENOUS | Status: DC
  Filled 2015-03-30: qty 40

## 2015-03-30 MED ORDER — SODIUM CHLORIDE 0.9 % IV SOLN
INTRAVENOUS | Status: DC
  Filled 2015-03-30: qty 2.5

## 2015-03-30 MED ORDER — ROSUVASTATIN CALCIUM 10 MG PO TABS
5.0000 mg | ORAL_TABLET | Freq: Every day | ORAL | Status: DC
  Administered 2015-03-30: 5 mg via ORAL
  Filled 2015-03-30: qty 1

## 2015-03-30 MED ORDER — ALBUTEROL SULFATE (2.5 MG/3ML) 0.083% IN NEBU
2.5000 mg | INHALATION_SOLUTION | Freq: Once | RESPIRATORY_TRACT | Status: AC
  Administered 2015-03-30: 2.5 mg via RESPIRATORY_TRACT

## 2015-03-30 MED ORDER — SODIUM CHLORIDE 0.9 % IV SOLN
INTRAVENOUS | Status: DC
  Filled 2015-03-30: qty 30

## 2015-03-30 MED ORDER — LOSARTAN POTASSIUM 25 MG PO TABS
25.0000 mg | ORAL_TABLET | Freq: Every day | ORAL | Status: DC
  Administered 2015-03-30: 25 mg via ORAL
  Filled 2015-03-30: qty 1

## 2015-03-30 MED ORDER — METOPROLOL TARTRATE 12.5 MG HALF TABLET
12.5000 mg | ORAL_TABLET | Freq: Two times a day (BID) | ORAL | Status: DC
  Administered 2015-03-30 (×2): 12.5 mg via ORAL
  Filled 2015-03-30 (×2): qty 1

## 2015-03-30 MED ORDER — METOPROLOL TARTRATE 12.5 MG HALF TABLET
12.5000 mg | ORAL_TABLET | Freq: Once | ORAL | Status: AC
  Administered 2015-03-31: 12.5 mg via ORAL
  Filled 2015-03-30: qty 1

## 2015-03-30 MED ORDER — DEXMEDETOMIDINE HCL IN NACL 400 MCG/100ML IV SOLN
0.1000 ug/kg/h | INTRAVENOUS | Status: DC
  Filled 2015-03-30: qty 100

## 2015-03-30 MED ORDER — CHLORHEXIDINE GLUCONATE CLOTH 2 % EX PADS
6.0000 | MEDICATED_PAD | Freq: Once | CUTANEOUS | Status: AC
  Administered 2015-03-30: 6 via TOPICAL

## 2015-03-30 MED ORDER — DOPAMINE-DEXTROSE 3.2-5 MG/ML-% IV SOLN
0.0000 ug/kg/min | INTRAVENOUS | Status: DC
  Filled 2015-03-30 (×2): qty 250

## 2015-03-30 MED ORDER — VANCOMYCIN HCL 10 G IV SOLR
1250.0000 mg | INTRAVENOUS | Status: AC
  Administered 2015-03-31: 1250 mg via INTRAVENOUS
  Filled 2015-03-30: qty 1250

## 2015-03-30 MED ORDER — MAGNESIUM SULFATE 50 % IJ SOLN
40.0000 meq | INTRAMUSCULAR | Status: DC
  Filled 2015-03-30 (×2): qty 10

## 2015-03-30 MED ORDER — DEXTROSE 5 % IV SOLN
750.0000 mg | INTRAVENOUS | Status: DC
  Filled 2015-03-30 (×2): qty 750

## 2015-03-30 MED ORDER — SODIUM CHLORIDE 0.9 % IV SOLN
INTRAVENOUS | Status: DC
  Filled 2015-03-30: qty 40

## 2015-03-30 MED ORDER — DIAZEPAM 5 MG PO TABS
5.0000 mg | ORAL_TABLET | Freq: Once | ORAL | Status: AC
  Administered 2015-03-31: 5 mg via ORAL
  Filled 2015-03-30: qty 1

## 2015-03-30 MED ORDER — PLASMA-LYTE 148 IV SOLN
INTRAVENOUS | Status: AC
  Administered 2015-03-31: 500 mL
  Filled 2015-03-30: qty 2.5

## 2015-03-30 MED ORDER — DEXTROSE 5 % IV SOLN
1.5000 g | INTRAVENOUS | Status: AC
  Administered 2015-03-31: .75 g via INTRAVENOUS
  Administered 2015-03-31: 1.5 g via INTRAVENOUS
  Filled 2015-03-30: qty 1.5

## 2015-03-30 MED ORDER — CHLORHEXIDINE GLUCONATE CLOTH 2 % EX PADS: 6.0000 | MEDICATED_PAD | Freq: Once | CUTANEOUS | Status: AC

## 2015-03-30 MED ORDER — PHENYLEPHRINE HCL 10 MG/ML IJ SOLN
30.0000 ug/min | INTRAVENOUS | Status: DC
  Filled 2015-03-30: qty 2

## 2015-03-30 MED ORDER — NITROGLYCERIN IN D5W 200-5 MCG/ML-% IV SOLN
2.0000 ug/min | INTRAVENOUS | Status: AC
Start: 2015-03-31 — End: 2015-03-31
  Administered 2015-03-31: 5 ug/min via INTRAVENOUS
  Filled 2015-03-30: qty 250

## 2015-03-30 NOTE — Progress Notes (Signed)
CARDIAC REHAB PHASE I   PRE:  Rate/Rhythm: 62 SR  BP:  Supine: 130/71  Sitting:   Standing:    SaO2: 98%RA  MODE:  Ambulation: 700 ft   POST:  Rate/Rhythm: 75 SR  BP:  Supine:   Sitting: 136/80  Standing:    SaO2: 99 %RA 0945-1030 Pt walked 700 ft with steady gait and no CP. Tolerated well. Education completed re importance of walking and IS after surgery. Gave pt IS and he got to top effortlessly. Had OHS booklet. Gave OHS care guide and wrote how to view pre op video. Reviewed sternal precautions. Pt stated he will be interested in CRP 2 in [place]. Will refer after surgery. Family will be available to take care of pt after discharge.    Graylon Good, RN BSN  03/30/2015 10:25 AM

## 2015-03-30 NOTE — Progress Notes (Addendum)
VASCULAR LAB PRELIMINARY  PRELIMINARY  PRELIMINARY  PRELIMINARY  Pre-op Cardiac Surgery  Carotid Findings:  Bilateral:  1-39% ICA stenosis.  Vertebral artery flow is antegrade.     Upper Extremity Right Left  Brachial Pressures 106 Triphasic 111 Triphasic  Radial Waveforms Triphasic Triphasic  Ulnar Waveforms Triphasic Triphasic  Palmar Arch (Allen's Test) Normal Normal   Findings:  Doppler waveforms remained normal with both radial and ulnar compressions.    Lower  Extremity Right Left  Dorsalis Pedis 142 Triphasic 152 Triphasic  Posterior Tibial 143 Triphasic 144 Triphasic  Ankle/Brachial Indices 1.3 1.4    Findings:  ABIs and Doppler waveforms are within normal limits bilaterally at rest.   Irbin Fines, RVS 03/30/2015, 2:47 PM  WOOD, Tami, RVT 03/30/2015 2:47 PM

## 2015-03-30 NOTE — Progress Notes (Signed)
1 Day Post-Op Procedure(s) (LRB): Left Heart Cath and Coronary Angiography (N/A) Subjective:  No chest pain today  Objective: Vital signs in last 24 hours: Temp:  [97.9 F (36.6 C)-98.4 F (36.9 C)] 98 F (36.7 C) (12/01 1949) Pulse Rate:  [46-62] 59 (12/01 2000) Cardiac Rhythm:  [-] Heart block (12/01 1900) Resp:  [10-28] 19 (12/01 2000) BP: (76-146)/(61-95) 146/95 mmHg (12/01 1625) SpO2:  [97 %-100 %] 97 % (12/01 2000)  Hemodynamic parameters for last 24 hours:    Intake/Output from previous day:   Intake/Output this shift:    General appearance: alert and cooperative Heart: regular rate and rhythm, S1, S2 normal, no murmur, click, rub or gallop Lungs: clear to auscultation bilaterally  Lab Results:  Recent Labs  03/28/15 1009 03/29/15 1814  WBC 6.1 7.0  HGB 15.7 14.2  HCT 44.2 42.1  PLT 181 160   BMET:  Recent Labs  03/28/15 1009 03/29/15 1814  NA 137  --   K 4.2  --   CL 101  --   CO2 27  --   GLUCOSE 89  --   BUN 18  --   CREATININE 1.18 1.01  CALCIUM 9.8  --     PT/INR:  Recent Labs  03/28/15 1009  LABPROT 12.3  INR 0.91   ABG    Component Value Date/Time   PHART 7.416 03/30/2015 2008   HCO3 25.7* 03/30/2015 2008   TCO2 27 03/30/2015 2008   O2SAT 92.0 03/30/2015 2008   CBG (last 3)  No results for input(s): GLUCAP in the last 72 hours.  Assessment/Plan:  Severe multi-vessel coronary artery disease. Plan CABG in the am. I discussed the operative procedure with the patient and his wife including alternatives, benefits and risks; including but not limited to bleeding, blood transfusion, infection, stroke, myocardial infarction, graft failure, heart block requiring a permanent pacemaker, organ dysfunction, and death.  Kenneth Vance understands and agrees to proceed.      LOS: 1 day    Gaye Pollack 03/30/2015

## 2015-03-30 NOTE — Progress Notes (Signed)
59 y/o man with poorly controlled hyperlipidemia who was seen by Dr. Acie Fredrickson for signs symptoms of likely progressive angina. He is very active, walking 3+ miles the day prior to his catheterization. He was trying to actually get back into it exercise routine where he was running, but was not able to do more than a brisk walk. This is what led him to be evaluated for exertional anginal symptoms that it progressively worsened. Found to have multivessel CAD on cardiac catheterization and now referred for CABG. He has not had any resting anginal symptoms prior to or following his hospitalization. Has had documentation of significant cramping from several different statins. As far as we can tell he has not yet tried Crestor.  Subjective:  Stable with no active anginal symptoms. Radial Access site remained stable   Objective:  Vital Signs in the last 24 hours: Temp:  [97.9 F (36.6 C)-98.3 F (36.8 C)] 97.9 F (36.6 C) (12/01 1206) Pulse Rate:  [46-66] 46 (12/01 1300) Resp:  [10-28] 13 (12/01 1300) BP: (76-165)/(60-86) 122/70 mmHg (12/01 1300) SpO2:  [97 %-100 %] 98 % (12/01 1300) Weight:  [208 lb (94.348 kg)] 208 lb (94.348 kg) (11/30 1745)  Intake/Output from previous day:   Intake/Output from this shift: Total I/O In: 240 [P.O.:240] Out: -    Physical Exam: GEN: Well nourished, well developed, in no acute distress  HEENT: normal  Neck: no JVD, carotid bruits, or masses Cardiac: RRR; no murmurs, rubs, or gallops,no edema  Respiratory: clear to auscultation bilaterally, normal work of breathing GI: soft, nontender, nondistended, + BS MS: no deformity or atrophy - right radial cath site C/D/I. Dressing in place. Positive Barbara Skin: warm and dry, no rash Neuro: Strength and sensation are intact Psych: normal  Lab Results:  Recent Labs  03/28/15 1009 03/29/15 1814  WBC 6.1 7.0  HGB 15.7 14.2  PLT 181 160    Recent Labs  03/28/15 1009 03/29/15 1814  NA 137  --     K 4.2  --   CL 101  --   CO2 27  --   GLUCOSE 89  --   BUN 18  --   CREATININE 1.18 1.01   No results for input(s): TROPONINI in the last 72 hours.  Invalid input(s): CK, MB Hepatic Function Panel  Recent Labs  03/28/15 1009  PROT 7.4  ALBUMIN 4.5  AST 14  ALT 15  ALKPHOS 57  BILITOT 0.5    Recent Labs  03/28/15 1009  CHOL 258*   No results for input(s): PROTIME in the last 72 hours.  Imaging: Imaging results have been reviewed - no new studies  Cardiac Studies:  Cardiac catheterization 03/29/2015  Conclusion     Ost RPDA to RPDA lesion, 90% stenosed. High bifurcation of PDA and PLA. PLA is free of severe disease.  Ost Ramus lesion, 90% stenosed.  Ost 1st Mrg to 1st Mrg lesion, 90% stenosed.  Ost LAD to Prox LAD lesion, 90% stenosed.  The left ventricular systolic function is normal.  Left Ventricle The left ventricular size is normal. The left ventricular systolic function is normal. The left ventricular ejection fraction is 55-65% by visual estimate. There are no wall motion abnormalities in the left ventricle.   Aortic Valve There is no aortic valve stenosis.     Severe three vessel disease which would be best treated with CABG.     Assessment/Plan:  Principal Problem:   Angina, class II ([employer]) - progressive Active Problems:  CAD, multiple vessel: Ost-Prox LAD 90%, RI & OM1 ost 90%, rPDA ost 90%   Hyperlipidemia with target LDL less than 70; Cramping with Statins   Essential hypertension   Angina pectoris (HCC)  Overall stable with likely progressive angina, but very active despite having severe CAD on catheterization. The extent of CAD warrants evaluation for CABG. He was seen by Dr. Cyndia Bent, who plans CABG tomorrow.   Will attempt low-dose beta blocker, but will need to monitor for bradycardia.  Has losartan written as antihypertensive agent, but has not yet started this. Start 25 mg today.  History of cramping with multiple statins,  detailed evaluation by our pharmacy staff indicates that he has not yet tried Crestor. Will start low-dose Crestor 5 mg daily. If he is not able tolerate, I do agree with referral to lipid clinic for considering the possibility of PCSK9 inhibitor.  On aspirin, but no other for CABG tomorrow.   Okay to be off telemetry.   LOS: 1 day    HARDING, Kenneth Vance 03/30/2015, 4:11 PM

## 2015-03-31 ENCOUNTER — Inpatient Hospital Stay (HOSPITAL_COMMUNITY): Payer: BLUE CROSS/BLUE SHIELD | Admitting: Anesthesiology

## 2015-03-31 ENCOUNTER — Inpatient Hospital Stay (HOSPITAL_COMMUNITY)
Admission: AD | Admit: 2015-03-31 | Discharge: 2015-03-31 | Disposition: A | Discharge status: Home / Self Care | Payer: BLUE CROSS/BLUE SHIELD | Source: Ambulatory Visit | Attending: Surgery | Admitting: Surgery

## 2015-03-31 ENCOUNTER — Encounter (HOSPITAL_COMMUNITY)
Admission: AD | Disposition: A | Discharge status: Home / Self Care | Payer: BLUE CROSS/BLUE SHIELD | Source: Ambulatory Visit | Attending: Surgery

## 2015-03-31 ENCOUNTER — Inpatient Hospital Stay (HOSPITAL_COMMUNITY): Payer: BLUE CROSS/BLUE SHIELD

## 2015-03-31 DIAGNOSIS — Z951 Presence of aortocoronary bypass graft: Secondary | ICD-10-CM

## 2015-03-31 DIAGNOSIS — I25110 Atherosclerotic heart disease of native coronary artery with unstable angina pectoris: Secondary | ICD-10-CM

## 2015-03-31 HISTORY — DX: Presence of aortocoronary bypass graft: Z95.1

## 2015-03-31 HISTORY — PX: CORONARY ARTERY BYPASS GRAFT: SHX141

## 2015-03-31 HISTORY — PX: TEE WITHOUT CARDIOVERSION: SHX5443

## 2015-03-31 LAB — POCT I-STAT, CHEM 8
BUN: 14 mg/dL (ref 6–20)
BUN: 12 mg/dL (ref 6–20)
BUN: 12 mg/dL (ref 6–20)
BUN: 11 mg/dL (ref 6–20)
BUN: 12 mg/dL (ref 6–20)
BUN: 11 mg/dL (ref 6–20)
CALCIUM ION: 1.24 mmol/L — AB (ref 1.12–1.23)
CALCIUM ION: 1.44 mmol/L — AB (ref 1.12–1.23)
CHLORIDE: 104 mmol/L (ref 101–111)
CHLORIDE: 105 mmol/L (ref 101–111)
CREATININE: 0.8 mg/dL (ref 0.61–1.24)
CREATININE: 0.8 mg/dL (ref 0.61–1.24)
Calcium, Ion: 1.08 mmol/L — ABNORMAL LOW (ref 1.12–1.23)
Calcium, Ion: 1.22 mmol/L (ref 1.12–1.23)
Calcium, Ion: 1.11 mmol/L — ABNORMAL LOW (ref 1.12–1.23)
Calcium, Ion: 1.23 mmol/L (ref 1.12–1.23)
Chloride: 103 mmol/L (ref 101–111)
Chloride: 104 mmol/L (ref 101–111)
Chloride: 103 mmol/L (ref 101–111)
Chloride: 103 mmol/L (ref 101–111)
Creatinine, Ser: 0.8 mg/dL (ref 0.61–1.24)
Creatinine, Ser: 0.8 mg/dL (ref 0.61–1.24)
Creatinine, Ser: 1 mg/dL (ref 0.61–1.24)
Creatinine, Ser: 0.9 mg/dL (ref 0.61–1.24)
GLUCOSE: 114 mg/dL — AB (ref 65–99)
Glucose, Bld: 102 mg/dL — ABNORMAL HIGH (ref 65–99)
Glucose, Bld: 110 mg/dL — ABNORMAL HIGH (ref 65–99)
Glucose, Bld: 111 mg/dL — ABNORMAL HIGH (ref 65–99)
Glucose, Bld: 114 mg/dL — ABNORMAL HIGH (ref 65–99)
Glucose, Bld: 138 mg/dL — ABNORMAL HIGH (ref 65–99)
HCT: 40 % (ref 39.0–52.0)
HEMATOCRIT: 30 % — AB (ref 39.0–52.0)
HEMATOCRIT: 38 % — AB (ref 39.0–52.0)
HEMATOCRIT: 32 % — AB (ref 39.0–52.0)
HEMATOCRIT: 33 % — AB (ref 39.0–52.0)
HEMATOCRIT: 33 % — AB (ref 39.0–52.0)
HEMOGLOBIN: 13.6 g/dL (ref 13.0–17.0)
HEMOGLOBIN: 10.2 g/dL — AB (ref 13.0–17.0)
HEMOGLOBIN: 12.9 g/dL — AB (ref 13.0–17.0)
HEMOGLOBIN: 10.9 g/dL — AB (ref 13.0–17.0)
Hemoglobin: 11.2 g/dL — ABNORMAL LOW (ref 13.0–17.0)
Hemoglobin: 11.2 g/dL — ABNORMAL LOW (ref 13.0–17.0)
POTASSIUM: 4.1 mmol/L (ref 3.5–5.1)
POTASSIUM: 4.3 mmol/L (ref 3.5–5.1)
POTASSIUM: 4.5 mmol/L (ref 3.5–5.1)
POTASSIUM: 4.4 mmol/L (ref 3.5–5.1)
Potassium: 5.6 mmol/L — ABNORMAL HIGH (ref 3.5–5.1)
Potassium: 4.3 mmol/L (ref 3.5–5.1)
SODIUM: 142 mmol/L (ref 135–145)
SODIUM: 138 mmol/L (ref 135–145)
SODIUM: 141 mmol/L (ref 135–145)
SODIUM: 141 mmol/L (ref 135–145)
Sodium: 141 mmol/L (ref 135–145)
Sodium: 141 mmol/L (ref 135–145)
TCO2: 28 mmol/L (ref 0–100)
TCO2: 29 mmol/L (ref 0–100)
TCO2: 30 mmol/L (ref 0–100)
TCO2: 27 mmol/L (ref 0–100)
TCO2: 25 mmol/L (ref 0–100)
TCO2: 27 mmol/L (ref 0–100)

## 2015-03-31 LAB — CREATININE, SERUM
Creatinine, Ser: 0.95 mg/dL (ref 0.61–1.24)
GFR calc Af Amer: >60 mL/min (ref >60)
GFR calc non Af Amer: >60 mL/min (ref >60)

## 2015-03-31 LAB — POCT I-STAT 3, ART BLOOD GAS (G3+)
ACID-BASE EXCESS: 4 mmol/L — AB (ref 0.0–2.0)
ACID-BASE EXCESS: 1 mmol/L (ref 0.0–2.0)
Acid-Base Excess: 4 mmol/L — ABNORMAL HIGH (ref 0.0–2.0)
BICARBONATE: 29.4 meq/L — AB (ref 20.0–24.0)
BICARBONATE: 25.9 meq/L — AB (ref 20.0–24.0)
BICARBONATE: 25.7 meq/L — AB (ref 20.0–24.0)
Bicarbonate: 29.6 mEq/L — ABNORMAL HIGH (ref 20.0–24.0)
Bicarbonate: 25.4 mEq/L — ABNORMAL HIGH (ref 20.0–24.0)
O2 SAT: 99 %
O2 SAT: 99 %
O2 Saturation: 100 %
O2 Saturation: 100 %
O2 Saturation: 99 %
PCO2 ART: 48.6 mmHg — AB (ref 35.0–45.0)
PCO2 ART: 40 mmHg (ref 35.0–45.0)
PH ART: 7.39 (ref 7.350–7.450)
PH ART: 7.363 (ref 7.350–7.450)
TCO2: 31 mmol/L (ref 0–100)
TCO2: 31 mmol/L (ref 0–100)
TCO2: 27 mmol/L (ref 0–100)
TCO2: 27 mmol/L (ref 0–100)
TCO2: 27 mmol/L (ref 0–100)
pCO2 arterial: 46.7 mmHg — ABNORMAL HIGH (ref 35.0–45.0)
pCO2 arterial: 44.9 mmHg (ref 35.0–45.0)
pCO2 arterial: 45 mmHg (ref 35.0–45.0)
pH, Arterial: 7.41 (ref 7.350–7.450)
pH, Arterial: 7.362 (ref 7.350–7.450)
pH, Arterial: 7.411 (ref 7.350–7.450)
pO2, Arterial: 514 mmHg — ABNORMAL HIGH (ref 80.0–100.0)
pO2, Arterial: 433 mmHg — ABNORMAL HIGH (ref 80.0–100.0)
pO2, Arterial: 128 mmHg — ABNORMAL HIGH (ref 80.0–100.0)
pO2, Arterial: 122 mmHg — ABNORMAL HIGH (ref 80.0–100.0)
pO2, Arterial: 144 mmHg — ABNORMAL HIGH (ref 80.0–100.0)

## 2015-03-31 LAB — PLATELET COUNT: Platelets: 127 10*3/uL — ABNORMAL LOW (ref 150–400)

## 2015-03-31 LAB — BASIC METABOLIC PANEL
ANION GAP: 9 (ref 5–15)
BUN: 13 mg/dL (ref 6–20)
CHLORIDE: 102 mmol/L (ref 101–111)
CO2: 28 mmol/L (ref 22–32)
CREATININE: 1.1 mg/dL (ref 0.61–1.24)
Calcium: 9.3 mg/dL (ref 8.9–10.3)
GFR calc non Af Amer: >60 mL/min (ref >60)
Glucose, Bld: 104 mg/dL — ABNORMAL HIGH (ref 65–99)
Potassium: 4.2 mmol/L (ref 3.5–5.1)
SODIUM: 139 mmol/L (ref 135–145)

## 2015-03-31 LAB — CBC
HCT: 35.5 % — ABNORMAL LOW (ref 39.0–52.0)
HEMATOCRIT: 41.6 % (ref 39.0–52.0)
HEMATOCRIT: 33.3 % — AB (ref 39.0–52.0)
HEMOGLOBIN: 14.2 g/dL (ref 13.0–17.0)
HEMOGLOBIN: 11.3 g/dL — AB (ref 13.0–17.0)
Hemoglobin: 12.2 g/dL — ABNORMAL LOW (ref 13.0–17.0)
MCH: 30.8 pg (ref 26.0–34.0)
MCH: 30.7 pg (ref 26.0–34.0)
MCH: 30.1 pg (ref 26.0–34.0)
MCHC: 34.1 g/dL (ref 30.0–36.0)
MCHC: 34.4 g/dL (ref 30.0–36.0)
MCHC: 33.9 g/dL (ref 30.0–36.0)
MCV: 90.2 fL (ref 78.0–100.0)
MCV: 89.4 fL (ref 78.0–100.0)
MCV: 88.6 fL (ref 78.0–100.0)
PLATELETS: 111 10*3/uL — AB (ref 150–400)
Platelets: 156 10*3/uL (ref 150–400)
Platelets: 120 10*3/uL — ABNORMAL LOW (ref 150–400)
RBC: 4.61 MIL/uL (ref 4.22–5.81)
RBC: 3.97 MIL/uL — AB (ref 4.22–5.81)
RBC: 3.76 MIL/uL — ABNORMAL LOW (ref 4.22–5.81)
RDW: 12.4 % (ref 11.5–15.5)
RDW: 12.4 % (ref 11.5–15.5)
RDW: 12.3 % (ref 11.5–15.5)
WBC: 6.2 10*3/uL (ref 4.0–10.5)
WBC: 9.4 10*3/uL (ref 4.0–10.5)
WBC: 10.2 10*3/uL (ref 4.0–10.5)

## 2015-03-31 LAB — POCT I-STAT 4, (NA,K, GLUC, HGB,HCT)
GLUCOSE: 108 mg/dL — AB (ref 65–99)
HCT: 33 % — ABNORMAL LOW (ref 39.0–52.0)
Hemoglobin: 11.2 g/dL — ABNORMAL LOW (ref 13.0–17.0)
Potassium: 3.8 mmol/L (ref 3.5–5.1)
Sodium: 140 mmol/L (ref 135–145)

## 2015-03-31 LAB — GLUCOSE, CAPILLARY
GLUCOSE-CAPILLARY: 121 mg/dL — AB (ref 65–99)
GLUCOSE-CAPILLARY: 133 mg/dL — AB (ref 65–99)

## 2015-03-31 LAB — MAGNESIUM: MAGNESIUM: 2.5 mg/dL — AB (ref 1.7–2.4)

## 2015-03-31 LAB — HEMOGLOBIN AND HEMATOCRIT, BLOOD
HEMATOCRIT: 32.4 % — AB (ref 39.0–52.0)
HEMOGLOBIN: 11.1 g/dL — AB (ref 13.0–17.0)

## 2015-03-31 LAB — APTT
APTT: 37 s (ref 24–37)
APTT: 34 s (ref 24–37)

## 2015-03-31 LAB — PROTIME-INR
INR: 1.31 (ref 0.00–1.49)
Prothrombin Time: 16.4 seconds — ABNORMAL HIGH (ref 11.6–15.2)

## 2015-03-31 SURGERY — CORONARY ARTERY BYPASS GRAFTING (CABG)
Anesthesia: General | Site: Chest

## 2015-03-31 MED ORDER — ROCURONIUM BROMIDE 50 MG/5ML IV SOLN
INTRAVENOUS | Status: AC
  Filled 2015-03-31: qty 3

## 2015-03-31 MED ORDER — MORPHINE SULFATE (PF) 2 MG/ML IV SOLN
2.0000 mg | INTRAVENOUS | Status: DC | PRN
  Administered 2015-03-31 – 2015-04-01 (×7): 2 mg via INTRAVENOUS
  Filled 2015-03-31 (×4): qty 1
  Filled 2015-03-31: qty 2
  Filled 2015-03-31 (×2): qty 1
  Filled 2015-03-31: qty 2

## 2015-03-31 MED ORDER — LACTATED RINGERS IV SOLN
INTRAVENOUS | Status: DC
  Administered 2015-03-31: 20 mL/h via INTRAVENOUS

## 2015-03-31 MED ORDER — DEXMEDETOMIDINE HCL IN NACL 200 MCG/50ML IV SOLN
0.0000 ug/kg/h | INTRAVENOUS | Status: DC
  Administered 2015-03-31: 0.7 ug/kg/h via INTRAVENOUS
  Administered 2015-04-01: 0.2 ug/kg/h via INTRAVENOUS
  Filled 2015-03-31 (×2): qty 50

## 2015-03-31 MED ORDER — MIDAZOLAM HCL 5 MG/5ML IJ SOLN
INTRAMUSCULAR | Status: DC | PRN
  Administered 2015-03-31: 3 mg via INTRAVENOUS
  Administered 2015-03-31 (×2): 1 mg via INTRAVENOUS
  Administered 2015-03-31: 2 mg via INTRAVENOUS
  Administered 2015-03-31: 1 mg via INTRAVENOUS

## 2015-03-31 MED ORDER — HEMOSTATIC AGENTS (NO CHARGE) OPTIME
TOPICAL | Status: DC | PRN
  Administered 2015-03-31: 1 via TOPICAL

## 2015-03-31 MED ORDER — PANTOPRAZOLE SODIUM 40 MG PO TBEC
40.0000 mg | DELAYED_RELEASE_TABLET | Freq: Every day | ORAL | Status: DC
  Administered 2015-04-02 – 2015-04-05 (×4): 40 mg via ORAL
  Filled 2015-03-31 (×4): qty 1

## 2015-03-31 MED ORDER — ALBUMIN HUMAN 5 % IV SOLN
250.0000 mL | INTRAVENOUS | Status: AC | PRN
  Administered 2015-03-31 – 2015-04-01 (×4): 250 mL via INTRAVENOUS
  Filled 2015-03-31 (×2): qty 250

## 2015-03-31 MED ORDER — PROPOFOL 10 MG/ML IV BOLUS
INTRAVENOUS | Status: DC | PRN
  Administered 2015-03-31: 70 mg via INTRAVENOUS

## 2015-03-31 MED ORDER — VECURONIUM BROMIDE 10 MG IV SOLR
INTRAVENOUS | Status: DC | PRN
  Administered 2015-03-31: 1 mg via INTRAVENOUS
  Administered 2015-03-31: 5 mg via INTRAVENOUS

## 2015-03-31 MED ORDER — PHENYLEPHRINE 40 MCG/ML (10ML) SYRINGE FOR IV PUSH (FOR BLOOD PRESSURE SUPPORT)
PREFILLED_SYRINGE | INTRAVENOUS | Status: AC
  Filled 2015-03-31: qty 10

## 2015-03-31 MED ORDER — SODIUM CHLORIDE 0.9 % IV SOLN
250.0000 [IU] | INTRAVENOUS | Status: DC | PRN
  Administered 2015-03-31: 1 [IU]/h via INTRAVENOUS

## 2015-03-31 MED ORDER — EPHEDRINE SULFATE 50 MG/ML IJ SOLN
INTRAMUSCULAR | Status: DC | PRN
  Administered 2015-03-31 (×3): 5 mg via INTRAVENOUS

## 2015-03-31 MED ORDER — VANCOMYCIN HCL IN DEXTROSE 1-5 GM/200ML-% IV SOLN
1000.0000 mg | Freq: Once | INTRAVENOUS | Status: AC
  Administered 2015-03-31: 1000 mg via INTRAVENOUS
  Filled 2015-03-31: qty 200

## 2015-03-31 MED ORDER — ACETAMINOPHEN 650 MG RE SUPP
650.0000 mg | Freq: Once | RECTAL | Status: AC
  Administered 2015-03-31: 650 mg via RECTAL

## 2015-03-31 MED ORDER — ACETAMINOPHEN 160 MG/5ML PO SOLN
650.0000 mg | Freq: Once | ORAL | Status: AC
Start: 2015-03-31 — End: 2015-03-31

## 2015-03-31 MED ORDER — BISACODYL 5 MG PO TBEC
10.0000 mg | DELAYED_RELEASE_TABLET | Freq: Every day | ORAL | Status: DC
  Administered 2015-04-01 – 2015-04-04 (×4): 10 mg via ORAL
  Filled 2015-03-31 (×5): qty 2

## 2015-03-31 MED ORDER — MAGNESIUM SULFATE 4 GM/100ML IV SOLN
4.0000 g | Freq: Once | INTRAVENOUS | Status: AC
  Administered 2015-03-31: 4 g via INTRAVENOUS
  Filled 2015-03-31: qty 100

## 2015-03-31 MED ORDER — CEFUROXIME SODIUM 1.5 G IJ SOLR
1.5000 g | Freq: Two times a day (BID) | INTRAMUSCULAR | Status: DC
  Administered 2015-04-01 (×3): 1.5 g via INTRAVENOUS
  Filled 2015-03-31 (×4): qty 1.5

## 2015-03-31 MED ORDER — ASPIRIN 81 MG PO CHEW: 324.0000 mg | CHEWABLE_TABLET | Freq: Every day | ORAL | Status: DC

## 2015-03-31 MED ORDER — ASPIRIN EC 325 MG PO TBEC
325.0000 mg | DELAYED_RELEASE_TABLET | Freq: Every day | ORAL | Status: DC
  Administered 2015-04-01 – 2015-04-05 (×5): 325 mg via ORAL
  Filled 2015-03-31 (×5): qty 1

## 2015-03-31 MED ORDER — SODIUM CHLORIDE 0.9 % IV SOLN
10.0000 g | INTRAVENOUS | Status: DC | PRN
  Administered 2015-03-31: 5 g/h via INTRAVENOUS

## 2015-03-31 MED ORDER — ANTISEPTIC ORAL RINSE SOLUTION (CORINZ)
7.0000 mL | OROMUCOSAL | Status: DC
  Administered 2015-03-31 – 2015-04-01 (×9): 7 mL via OROMUCOSAL

## 2015-03-31 MED ORDER — ARTIFICIAL TEARS OP OINT
TOPICAL_OINTMENT | OPHTHALMIC | Status: AC
  Filled 2015-03-31: qty 3.5

## 2015-03-31 MED ORDER — POTASSIUM CHLORIDE 10 MEQ/50ML IV SOLN
10.0000 meq | INTRAVENOUS | Status: AC
  Administered 2015-03-31 (×3): 10 meq via INTRAVENOUS

## 2015-03-31 MED ORDER — LIDOCAINE HCL (CARDIAC) 20 MG/ML IV SOLN
INTRAVENOUS | Status: AC
  Filled 2015-03-31: qty 5

## 2015-03-31 MED ORDER — DOCUSATE SODIUM 100 MG PO CAPS
200.0000 mg | ORAL_CAPSULE | Freq: Every day | ORAL | Status: DC
  Administered 2015-04-01 – 2015-04-04 (×4): 200 mg via ORAL
  Filled 2015-03-31 (×5): qty 2

## 2015-03-31 MED ORDER — PROTAMINE SULFATE 10 MG/ML IV SOLN
INTRAVENOUS | Status: DC | PRN
  Administered 2015-03-31: 10 mg via INTRAVENOUS
  Administered 2015-03-31: 320 mg via INTRAVENOUS

## 2015-03-31 MED ORDER — HEPARIN SODIUM (PORCINE) 1000 UNIT/ML IJ SOLN
INTRAMUSCULAR | Status: AC
  Filled 2015-03-31: qty 3

## 2015-03-31 MED ORDER — CHLORHEXIDINE GLUCONATE 4 % EX LIQD
CUTANEOUS | Status: AC
  Administered 2015-03-31: 05:00:00
  Filled 2015-03-31: qty 60

## 2015-03-31 MED ORDER — FAMOTIDINE IN NACL 20-0.9 MG/50ML-% IV SOLN
20.0000 mg | Freq: Two times a day (BID) | INTRAVENOUS | Status: DC
  Administered 2015-03-31: 20 mg via INTRAVENOUS

## 2015-03-31 MED ORDER — LACTATED RINGERS IV SOLN: 500.0000 mL | Freq: Once | INTRAVENOUS | Status: DC | PRN

## 2015-03-31 MED ORDER — CALCIUM CHLORIDE 10 % IV SOLN
INTRAVENOUS | Status: AC
  Filled 2015-03-31: qty 10

## 2015-03-31 MED ORDER — HEPARIN SODIUM (PORCINE) 1000 UNIT/ML IJ SOLN
INTRAMUSCULAR | Status: DC | PRN
  Administered 2015-03-31: 33000 [IU] via INTRAVENOUS

## 2015-03-31 MED ORDER — METOPROLOL TARTRATE 25 MG/10 ML ORAL SUSPENSION
12.5000 mg | Freq: Two times a day (BID) | ORAL | Status: DC
Start: 2015-03-31 — End: 2015-04-01

## 2015-03-31 MED ORDER — FENTANYL CITRATE (PF) 250 MCG/5ML IJ SOLN
INTRAMUSCULAR | Status: AC
  Filled 2015-03-31: qty 20

## 2015-03-31 MED ORDER — PROPOFOL 10 MG/ML IV BOLUS
INTRAVENOUS | Status: AC
  Filled 2015-03-31: qty 20

## 2015-03-31 MED ORDER — TRAMADOL HCL 50 MG PO TABS
50.0000 mg | ORAL_TABLET | ORAL | Status: DC | PRN
  Administered 2015-04-01 – 2015-04-03 (×4): 100 mg via ORAL
  Administered 2015-04-03: 50 mg via ORAL
  Administered 2015-04-03 – 2015-04-05 (×3): 100 mg via ORAL
  Administered 2015-04-05: 50 mg via ORAL
  Filled 2015-03-31: qty 1
  Filled 2015-03-31 (×9): qty 2

## 2015-03-31 MED ORDER — CALCIUM CHLORIDE 10 % IV SOLN
INTRAVENOUS | Status: DC | PRN
  Administered 2015-03-31 (×3): .2 g via INTRAVENOUS
  Administered 2015-03-31 (×2): .1 g via INTRAVENOUS
  Administered 2015-03-31: .2 g via INTRAVENOUS

## 2015-03-31 MED ORDER — DEXTROSE 5 % IV SOLN
20.0000 mg | INTRAVENOUS | Status: DC | PRN
  Administered 2015-03-31: 25 ug/min via INTRAVENOUS

## 2015-03-31 MED ORDER — LACTATED RINGERS IV SOLN
INTRAVENOUS | Status: DC | PRN
  Administered 2015-03-31: 07:00:00 via INTRAVENOUS

## 2015-03-31 MED ORDER — ACETAMINOPHEN 160 MG/5ML PO SOLN: 1000.0000 mg | Freq: Four times a day (QID) | ORAL | Status: DC

## 2015-03-31 MED ORDER — THROMBIN 20000 UNITS EX SOLR
CUTANEOUS | Status: AC
  Filled 2015-03-31: qty 20000

## 2015-03-31 MED ORDER — PHENYLEPHRINE HCL 10 MG/ML IJ SOLN
0.0000 ug/min | INTRAVENOUS | Status: DC
  Filled 2015-03-31: qty 2

## 2015-03-31 MED ORDER — SODIUM CHLORIDE 0.9 % IJ SOLN: 3.0000 mL | INTRAMUSCULAR | Status: DC | PRN

## 2015-03-31 MED ORDER — METOPROLOL TARTRATE 1 MG/ML IV SOLN: 2.5000 mg | INTRAVENOUS | Status: DC | PRN

## 2015-03-31 MED ORDER — FENTANYL CITRATE (PF) 250 MCG/5ML IJ SOLN
INTRAMUSCULAR | Status: AC
  Filled 2015-03-31: qty 5

## 2015-03-31 MED ORDER — FENTANYL CITRATE (PF) 100 MCG/2ML IJ SOLN
INTRAMUSCULAR | Status: DC | PRN
  Administered 2015-03-31 (×2): 100 ug via INTRAVENOUS
  Administered 2015-03-31 (×2): 150 ug via INTRAVENOUS
  Administered 2015-03-31 (×2): 50 ug via INTRAVENOUS
  Administered 2015-03-31 (×2): 100 ug via INTRAVENOUS
  Administered 2015-03-31: 50 ug via INTRAVENOUS
  Administered 2015-03-31: 100 ug via INTRAVENOUS
  Administered 2015-03-31: 150 ug via INTRAVENOUS
  Administered 2015-03-31: 50 ug via INTRAVENOUS
  Administered 2015-03-31: 100 ug via INTRAVENOUS
  Administered 2015-03-31: 150 ug via INTRAVENOUS
  Administered 2015-03-31: 100 ug via INTRAVENOUS

## 2015-03-31 MED ORDER — LACTATED RINGERS IV SOLN
INTRAVENOUS | Status: DC | PRN
  Administered 2015-03-31 (×2): via INTRAVENOUS

## 2015-03-31 MED ORDER — METOPROLOL TARTRATE 12.5 MG HALF TABLET
12.5000 mg | ORAL_TABLET | Freq: Two times a day (BID) | ORAL | Status: DC
  Administered 2015-04-02 – 2015-04-05 (×6): 12.5 mg via ORAL
  Filled 2015-03-31 (×7): qty 1

## 2015-03-31 MED ORDER — SODIUM CHLORIDE 0.9 % IV SOLN: INTRAVENOUS | Status: DC

## 2015-03-31 MED ORDER — SODIUM CHLORIDE 0.9 % IJ SOLN
3.0000 mL | Freq: Two times a day (BID) | INTRAMUSCULAR | Status: DC
  Administered 2015-04-01 – 2015-04-04 (×7): 3 mL via INTRAVENOUS

## 2015-03-31 MED ORDER — INSULIN REGULAR BOLUS VIA INFUSION
0.0000 [IU] | Freq: Three times a day (TID) | INTRAVENOUS | Status: DC
  Filled 2015-03-31: qty 10

## 2015-03-31 MED ORDER — ONDANSETRON HCL 4 MG/2ML IJ SOLN
4.0000 mg | Freq: Four times a day (QID) | INTRAMUSCULAR | Status: DC | PRN
Start: 2015-03-31 — End: 2015-04-05
  Administered 2015-03-31 – 2015-04-01 (×3): 4 mg via INTRAVENOUS
  Filled 2015-03-31 (×3): qty 2

## 2015-03-31 MED ORDER — THROMBIN 20000 UNITS EX SOLR
CUTANEOUS | Status: DC | PRN
  Administered 2015-03-31: 20000 [IU] via TOPICAL

## 2015-03-31 MED ORDER — CHLORHEXIDINE GLUCONATE 0.12 % MT SOLN
15.0000 mL | OROMUCOSAL | Status: AC
  Administered 2015-03-31: 15 mL via OROMUCOSAL
  Filled 2015-03-31: qty 15

## 2015-03-31 MED ORDER — INSULIN REGULAR HUMAN 100 UNIT/ML IJ SOLN
INTRAMUSCULAR | Status: DC
  Filled 2015-03-31: qty 2.5

## 2015-03-31 MED ORDER — ROCURONIUM BROMIDE 50 MG/5ML IV SOLN
INTRAVENOUS | Status: AC
  Filled 2015-03-31: qty 2

## 2015-03-31 MED ORDER — ACETAMINOPHEN 500 MG PO TABS
1000.0000 mg | ORAL_TABLET | Freq: Four times a day (QID) | ORAL | Status: DC
  Administered 2015-04-01 – 2015-04-03 (×7): 1000 mg via ORAL
  Filled 2015-03-31 (×10): qty 2

## 2015-03-31 MED ORDER — MORPHINE SULFATE (PF) 2 MG/ML IV SOLN
1.0000 mg | INTRAVENOUS | Status: DC | PRN
  Administered 2015-03-31 (×4): 2 mg via INTRAVENOUS
  Filled 2015-03-31: qty 1

## 2015-03-31 MED ORDER — EPHEDRINE SULFATE 50 MG/ML IJ SOLN
INTRAMUSCULAR | Status: AC
  Filled 2015-03-31: qty 1

## 2015-03-31 MED ORDER — SODIUM CHLORIDE 0.9 % IV SOLN: 250.0000 mL | INTRAVENOUS | Status: DC

## 2015-03-31 MED ORDER — NITROGLYCERIN IN D5W 200-5 MCG/ML-% IV SOLN
0.0000 ug/min | INTRAVENOUS | Status: DC
  Administered 2015-03-31: 10 ug/min via INTRAVENOUS

## 2015-03-31 MED ORDER — 0.9 % SODIUM CHLORIDE (POUR BTL) OPTIME
TOPICAL | Status: DC | PRN
  Administered 2015-03-31: 5000 mL

## 2015-03-31 MED ORDER — BISACODYL 10 MG RE SUPP: 10.0000 mg | Freq: Every day | RECTAL | Status: DC

## 2015-03-31 MED ORDER — ROCURONIUM BROMIDE 100 MG/10ML IV SOLN
INTRAVENOUS | Status: DC | PRN
  Administered 2015-03-31: 20 mg via INTRAVENOUS
  Administered 2015-03-31: 80 mg via INTRAVENOUS
  Administered 2015-03-31 (×2): 50 mg via INTRAVENOUS

## 2015-03-31 MED ORDER — SODIUM CHLORIDE 0.45 % IV SOLN
INTRAVENOUS | Status: DC | PRN
  Administered 2015-03-31: 20 mL/h via INTRAVENOUS

## 2015-03-31 MED ORDER — CHLORHEXIDINE GLUCONATE 0.12% ORAL RINSE (MEDLINE KIT)
15.0000 mL | Freq: Two times a day (BID) | OROMUCOSAL | Status: DC
  Administered 2015-03-31 – 2015-04-01 (×2): 15 mL via OROMUCOSAL

## 2015-03-31 MED ORDER — MIDAZOLAM HCL 10 MG/2ML IJ SOLN
INTRAMUSCULAR | Status: AC
  Filled 2015-03-31: qty 2

## 2015-03-31 MED ORDER — MIDAZOLAM HCL 2 MG/2ML IJ SOLN: 2.0000 mg | INTRAMUSCULAR | Status: DC | PRN

## 2015-03-31 MED ORDER — PROTAMINE SULFATE 10 MG/ML IV SOLN
INTRAVENOUS | Status: AC
  Filled 2015-03-31: qty 10

## 2015-03-31 MED ORDER — OXYCODONE HCL 5 MG PO TABS
5.0000 mg | ORAL_TABLET | ORAL | Status: DC | PRN
  Administered 2015-04-01 (×4): 10 mg via ORAL
  Administered 2015-04-02: 5 mg via ORAL
  Administered 2015-04-02: 10 mg via ORAL
  Administered 2015-04-03 (×3): 5 mg via ORAL
  Filled 2015-03-31: qty 2
  Filled 2015-03-31: qty 1
  Filled 2015-03-31: qty 2
  Filled 2015-03-31 (×3): qty 1
  Filled 2015-03-31 (×3): qty 2

## 2015-03-31 MED ORDER — DEXMEDETOMIDINE HCL IN NACL 200 MCG/50ML IV SOLN
INTRAVENOUS | Status: DC | PRN
  Administered 2015-03-31: .3 ug/kg/h via INTRAVENOUS

## 2015-03-31 MED ORDER — GELATIN ABSORBABLE MT POWD
OROMUCOSAL | Status: DC | PRN
  Administered 2015-03-31: 12 mL via TOPICAL

## 2015-03-31 MED ORDER — ARTIFICIAL TEARS OP OINT
TOPICAL_OINTMENT | OPHTHALMIC | Status: DC | PRN
  Administered 2015-03-31: 1 via OPHTHALMIC

## 2015-03-31 MED FILL — Magnesium Sulfate Inj 50%: INTRAMUSCULAR | Qty: 10 | Status: AC

## 2015-03-31 MED FILL — Potassium Chloride Inj 2 mEq/ML: INTRAVENOUS | Qty: 40 | Status: AC

## 2015-03-31 MED FILL — Heparin Sodium (Porcine) Inj 1000 Unit/ML: INTRAMUSCULAR | Qty: 30 | Status: AC

## 2015-03-31 SURGICAL SUPPLY — 114 items
BAG DECANTER FOR FLEXI CONT (MISCELLANEOUS) ×4 IMPLANT
BANDAGE ELASTIC 4 VELCRO ST LF (GAUZE/BANDAGES/DRESSINGS) ×4 IMPLANT
BANDAGE ELASTIC 6 VELCRO ST LF (GAUZE/BANDAGES/DRESSINGS) ×4 IMPLANT
BASKET HEART  (ORDER IN 25'S) (MISCELLANEOUS) ×1
BASKET HEART (ORDER IN 25'S) (MISCELLANEOUS) ×1
BASKET HEART (ORDER IN 25S) (MISCELLANEOUS) ×2 IMPLANT
BLADE CLIPPER SURG (BLADE) ×4 IMPLANT
BLADE STERNUM SYSTEM 6 (BLADE) ×4 IMPLANT
BNDG GAUZE ELAST 4 BULKY (GAUZE/BANDAGES/DRESSINGS) ×4 IMPLANT
CANISTER SUCTION 2500CC (MISCELLANEOUS) ×4 IMPLANT
CATH ROBINSON RED A/P 18FR (CATHETERS) ×8 IMPLANT
CATH THORACIC 28FR (CATHETERS) ×4 IMPLANT
CATH THORACIC 36FR (CATHETERS) ×4 IMPLANT
CATH THORACIC 36FR RT ANG (CATHETERS) ×4 IMPLANT
CLIP TI MEDIUM 24 (CLIP) IMPLANT
CLIP TI WIDE RED SMALL 24 (CLIP) ×4 IMPLANT
COVER SURGICAL LIGHT HANDLE (MISCELLANEOUS) ×4 IMPLANT
CRADLE DONUT ADULT HEAD (MISCELLANEOUS) ×4 IMPLANT
DRAPE CARDIOVASCULAR INCISE (DRAPES) ×2
DRAPE INCISE IOBAN 66X45 STRL (DRAPES) ×4 IMPLANT
DRAPE SLUSH/WARMER DISC (DRAPES) ×4 IMPLANT
DRAPE SRG 135X102X78XABS (DRAPES) ×2 IMPLANT
DRSG COVADERM 4X14 (GAUZE/BANDAGES/DRESSINGS) ×4 IMPLANT
ELECT CAUTERY BLADE 6.4 (BLADE) ×4 IMPLANT
ELECT REM PT RETURN 9FT ADLT (ELECTROSURGICAL) ×8
ELECTRODE REM PT RTRN 9FT ADLT (ELECTROSURGICAL) ×4 IMPLANT
GAUZE SPONGE 4X4 12PLY STRL (GAUZE/BANDAGES/DRESSINGS) ×8 IMPLANT
GLOVE BIO SURGEON STRL SZ 6 (GLOVE) ×24 IMPLANT
GLOVE BIO SURGEON STRL SZ 6.5 (GLOVE) ×18 IMPLANT
GLOVE BIO SURGEON STRL SZ7 (GLOVE) ×8 IMPLANT
GLOVE BIO SURGEON STRL SZ7.5 (GLOVE) ×8 IMPLANT
GLOVE BIO SURGEONS STRL SZ 6.5 (GLOVE) ×6
GLOVE BIOGEL PI IND STRL 6 (GLOVE) ×2 IMPLANT
GLOVE BIOGEL PI IND STRL 6.5 (GLOVE) ×2 IMPLANT
GLOVE BIOGEL PI IND STRL 7.0 (GLOVE) ×2 IMPLANT
GLOVE BIOGEL PI INDICATOR 6 (GLOVE) ×2
GLOVE BIOGEL PI INDICATOR 6.5 (GLOVE) ×2
GLOVE BIOGEL PI INDICATOR 7.0 (GLOVE) ×2
GLOVE EUDERMIC 7 POWDERFREE (GLOVE) ×8 IMPLANT
GLOVE ORTHO TXT STRL SZ7.5 (GLOVE) IMPLANT
GOWN STRL REUS W/ TWL LRG LVL3 (GOWN DISPOSABLE) ×8 IMPLANT
GOWN STRL REUS W/ TWL XL LVL3 (GOWN DISPOSABLE) ×2 IMPLANT
GOWN STRL REUS W/TWL LRG LVL3 (GOWN DISPOSABLE) ×8
GOWN STRL REUS W/TWL XL LVL3 (GOWN DISPOSABLE) ×2
HEMOSTAT POWDER SURGIFOAM 1G (HEMOSTASIS) ×12 IMPLANT
HEMOSTAT SURGICEL 2X14 (HEMOSTASIS) ×4 IMPLANT
INSERT FOGARTY 61MM (MISCELLANEOUS) ×4 IMPLANT
INSERT FOGARTY XLG (MISCELLANEOUS) ×4 IMPLANT
KIT BASIN OR (CUSTOM PROCEDURE TRAY) ×4 IMPLANT
KIT CATH CPB BARTLE (MISCELLANEOUS) ×4 IMPLANT
KIT ROOM TURNOVER OR (KITS) ×4 IMPLANT
KIT SUCTION CATH 14FR (SUCTIONS) ×4 IMPLANT
KIT VASOVIEW W/TROCAR VH 2000 (KITS) ×4 IMPLANT
NS IRRIG 1000ML POUR BTL (IV SOLUTION) ×20 IMPLANT
PACK OPEN HEART (CUSTOM PROCEDURE TRAY) ×4 IMPLANT
PAD ARMBOARD 7.5X6 YLW CONV (MISCELLANEOUS) ×8 IMPLANT
PAD ELECT DEFIB RADIOL ZOLL (MISCELLANEOUS) ×4 IMPLANT
PENCIL BUTTON HOLSTER BLD 10FT (ELECTRODE) ×4 IMPLANT
PUNCH AORTIC ROTATE 4.0MM (MISCELLANEOUS) IMPLANT
PUNCH AORTIC ROTATE 4.5MM 8IN (MISCELLANEOUS) ×4 IMPLANT
PUNCH AORTIC ROTATE 5MM 8IN (MISCELLANEOUS) IMPLANT
SENSOR MYOCARDIAL TEMP (MISCELLANEOUS) ×4 IMPLANT
SET CARDIOPLEGIA MPS 5001102 (MISCELLANEOUS) ×4 IMPLANT
SPONGE GAUZE 4X4 12PLY STER LF (GAUZE/BANDAGES/DRESSINGS) ×8 IMPLANT
SPONGE INTESTINAL PEANUT (DISPOSABLE) IMPLANT
SPONGE LAP 18X18 X RAY DECT (DISPOSABLE) ×4 IMPLANT
SPONGE LAP 4X18 X RAY DECT (DISPOSABLE) ×4 IMPLANT
SUT BONE WAX W31G (SUTURE) IMPLANT
SUT ETHIBOND 2 0 SH (SUTURE) ×8
SUT ETHIBOND 2 0 SH 36X2 (SUTURE) ×8 IMPLANT
SUT MNCRL AB 4-0 PS2 18 (SUTURE) IMPLANT
SUT PROLENE 3 0 SH DA (SUTURE) IMPLANT
SUT PROLENE 3 0 SH1 36 (SUTURE) ×4 IMPLANT
SUT PROLENE 4 0 RB 1 (SUTURE) ×4
SUT PROLENE 4 0 SH DA (SUTURE) IMPLANT
SUT PROLENE 4-0 RB1 .5 CRCL 36 (SUTURE) ×4 IMPLANT
SUT PROLENE 5 0 C 1 36 (SUTURE) ×8 IMPLANT
SUT PROLENE 6 0 C 1 30 (SUTURE) ×8 IMPLANT
SUT PROLENE 7 0 BV 1 (SUTURE) IMPLANT
SUT PROLENE 7 0 BV1 MDA (SUTURE) ×8 IMPLANT
SUT PROLENE 8 0 BV175 6 (SUTURE) ×8 IMPLANT
SUT SILK  1 MH (SUTURE) ×6
SUT SILK 1 MH (SUTURE) ×6 IMPLANT
SUT SILK 1 TIES 10X30 (SUTURE) ×4 IMPLANT
SUT SILK 2 0 SH CR/8 (SUTURE) ×8 IMPLANT
SUT SILK 2 0 TIES 10X30 (SUTURE) ×4 IMPLANT
SUT SILK 2 0 TIES 17X18 (SUTURE) ×2
SUT SILK 2-0 18XBRD TIE BLK (SUTURE) ×2 IMPLANT
SUT SILK 3 0 SH CR/8 (SUTURE) ×4 IMPLANT
SUT SILK 4 0 TIE 10X30 (SUTURE) ×8 IMPLANT
SUT STEEL STERNAL CCS#1 18IN (SUTURE) IMPLANT
SUT STEEL SZ 6 DBL 3X14 BALL (SUTURE) ×12 IMPLANT
SUT TEM PAC WIRE 2 0 SH (SUTURE) ×16 IMPLANT
SUT VIC AB 1 CTX 36 (SUTURE) ×4
SUT VIC AB 1 CTX36XBRD ANBCTR (SUTURE) ×4 IMPLANT
SUT VIC AB 2-0 CT1 27 (SUTURE) ×2
SUT VIC AB 2-0 CT1 TAPERPNT 27 (SUTURE) ×2 IMPLANT
SUT VIC AB 2-0 CTX 27 (SUTURE) IMPLANT
SUT VIC AB 3-0 SH 27 (SUTURE)
SUT VIC AB 3-0 SH 27X BRD (SUTURE) IMPLANT
SUT VIC AB 3-0 X1 27 (SUTURE) ×4 IMPLANT
SUT VICRYL 2 0 J607H (SUTURE) ×8 IMPLANT
SUT VICRYL 3 0 (SUTURE) ×8 IMPLANT
SUT VICRYL 4-0 PS2 18IN ABS (SUTURE) IMPLANT
SUTURE E-PAK OPEN HEART (SUTURE) ×4 IMPLANT
SYSTEM SAHARA CHEST DRAIN ATS (WOUND CARE) ×4 IMPLANT
TAPE CLOTH SURG 4X10 WHT LF (GAUZE/BANDAGES/DRESSINGS) ×4 IMPLANT
TAPE PAPER 3X10 WHT MICROPORE (GAUZE/BANDAGES/DRESSINGS) ×8 IMPLANT
TOWEL OR 17X24 6PK STRL BLUE (TOWEL DISPOSABLE) ×4 IMPLANT
TOWEL OR 17X26 10 PK STRL BLUE (TOWEL DISPOSABLE) ×4 IMPLANT
TRAY FOLEY IC TEMP SENS 16FR (CATHETERS) ×4 IMPLANT
TUBING INSUFFLATION (TUBING) ×4 IMPLANT
UNDERPAD 30X30 INCONTINENT (UNDERPADS AND DIAPERS) ×4 IMPLANT
WATER STERILE IRR 1000ML POUR (IV SOLUTION) ×8 IMPLANT

## 2015-03-31 NOTE — Op Note (Signed)
CARDIOVASCULAR SURGERY OPERATIVE NOTE  03/31/2015  Surgeon:  Gaye Pollack, MD  First Assistant: Jadene Pierini,  PA-C   Preoperative Diagnosis:  Severe multi-vessel coronary artery disease   Postoperative Diagnosis:  Same   Procedure:  1. Median Sternotomy 2. Extracorporeal circulation 3.   Coronary artery bypass grafting x 4   Left internal mammary graft to the LAD  SVG to Ramus  SVG to OM  SVG to PDA 4.   Endoscopic vein harvest from the right leg   Anesthesia:  General Endotracheal   Clinical History/Surgical Indication:  The patient is a 59 year old gentleman with hyperlipidemia who has not tolerated any medications for this and a strong family history of coronary artery disease on both sides of his family who reports about a one month history of exertional chest discomfort. This has only occurred with significant exertion like brisk walks or going up hills or stairs. He has been walking more trying to get back in shape but has noticed this chest discomfort. Sometimes he feels like he can walk through it and has had periods where he feels stronger with more stamina but then it recurs. Cath today shows severe multi-vessel coronary disease with a long 90% ostial to proximal LAD stenosis. The LCX has a small ramus branch with 90% stenosis and the OM1 has a 90% stenosis. The RPDA has a 90% stenosis. LV function is normal. He has severe multi-vessel coronary disease with progressive exertional angina. I agree that CABG is the best treatment for this patient. I discussed the operative procedure with the patient including alternatives, benefits and risks; including but not limited to bleeding, blood transfusion, infection, stroke, myocardial infarction, graft failure, heart block requiring a permanent pacemaker, organ dysfunction, and death. Benjaman Lobe understands and agrees to  proceed.  Preparation:  The patient was seen in the preoperative holding area and the correct patient, correct operation were confirmed with the patient after reviewing the medical record and catheterization. The consent was signed by me. Preoperative antibiotics were given. A pulmonary arterial line and radial arterial line were placed by the anesthesia team. The patient was taken back to the operating room and positioned supine on the operating room table. After being placed under general endotracheal anesthesia by the anesthesia team a foley catheter was placed. The neck, chest, abdomen, and both legs were prepped with betadine soap and solution and draped in the usual sterile manner. A surgical time-out was taken and the correct patient and operative procedure were confirmed with the nursing and anesthesia staff.   Cardiopulmonary Bypass:  A median sternotomy was performed. The pericardium was opened in the midline. Right ventricular function appeared normal. The ascending aorta was of normal size and had no palpable plaque. There were no contraindications to aortic cannulation or cross-clamping. The patient was fully systemically heparinized and the ACT was maintained > 400 sec. The proximal aortic arch was cannulated with a 20 F aortic cannula for arterial inflow. Venous cannulation was performed via the right atrial appendage using a two-staged venous cannula. An antegrade cardioplegia/vent cannula was inserted into the mid-ascending aorta. Aortic occlusion was performed with a single cross-clamp. Systemic cooling to 32 degrees Centigrade and topical cooling of the heart with iced saline were used. Hyperkalemic antegrade cold blood cardioplegia was used to induce diastolic arrest and was then given at about 20 minute intervals throughout the period of arrest to maintain myocardial temperature at or below 10 degrees centigrade. A temperature probe was inserted into the  interventricular septum and an  insulating pad was placed in the pericardium.   Left internal mammary harvest:  The left side of the sternum was retracted using the Rultract retractor. The left internal mammary artery was harvested as a pedicle graft. All side branches were clipped. It was a medium-sized vessel of good quality with excellent blood flow. It was ligated distally and divided. It was sprayed with topical papaverine solution to prevent vasospasm.   Endoscopic vein harvest:  The right greater saphenous vein was harvested endoscopically through a 2 cm incision medial to the right knee. It was harvested from the upper thigh to below the knee. It was a medium-sized vein of good quality. The side branches were all ligated with 4-0 silk ties.    Coronary arteries:  The coronary arteries were examined.   LAD:  Large vessel that is diffusely diseased.  LCX:  Ramus is moderate sized vessel diseased proximally but not distally. The OM is large, heavily diseased proximally but no distal disease  RCA:  PDA has ostial/proximal disease only.   Grafts:  1. LIMA to the LAD: 2.0 mm. It was sewn end to side using 8-0 prolene continuous suture. 2. SVG to Ramus:  1.75 mm. It was sewn end to side using 7-0 prolene continuous suture. 3. SVG to OM:  1.75 mm. It was sewn end to side using 7-0 prolene continuous suture. 4. SVG to PDA:  1.6 mm. It was sewn end to side using 7-0 prolene continuous suture.  The proximal vein graft anastomoses were performed to the mid-ascending aorta using continuous 6-0 prolene suture. Graft markers were placed around the proximal anastomoses.   Completion:  The patient was rewarmed to 37 degrees Centigrade. The clamp was removed from the LIMA pedicle and there was rapid warming of the septum and return of ventricular fibrillation. The crossclamp was removed with a time of 81 minutes. There was spontaneous return of sinus rhythm. The distal and proximal anastomoses were checked for  hemostasis. The position of the grafts was satisfactory. Two temporary epicardial pacing wires were placed on the right atrium and two on the right ventricle. The patient was weaned from CPB without difficulty on no inotropes. CPB time was 101 minutes. Cardiac output was 5 LPM. Heparin was fully reversed with protamine and the aortic and venous cannulas removed. Hemostasis was achieved. Mediastinal and left pleural drainage tubes were placed. The sternum was closed with double #6 stainless steel wires. The fascia was closed with continuous # 1 vicryl suture. The subcutaneous tissue was closed with 2-0 vicryl continuous suture. The skin was closed with 3-0 vicryl subcuticular suture. All sponge, needle, and instrument counts were reported correct at the end of the case. Dry sterile dressings were placed over the incisions and around the chest tubes which were connected to pleurevac suction. The patient was then transported to the surgical intensive care unit in critical but stable condition.

## 2015-03-31 NOTE — Anesthesia Procedure Notes (Addendum)
Procedure Name: Intubation Date/Time: 03/31/2015 7:53 AM Performed by: Suzy Bouchard Pre-anesthesia Checklist: Patient identified, Emergency Drugs available, Patient being monitored and Suction available Patient Re-evaluated:Patient Re-evaluated prior to inductionOxygen Delivery Method: Circle system utilized Preoxygenation: Pre-oxygenation with 100% oxygen Intubation Type: IV induction Ventilation: Mask ventilation without difficulty Laryngoscope Size: Mac and 3 Grade View: Grade I Tube type: Oral Tube size: 7.5 mm Number of attempts: 1 Airway Equipment and Method: Stylet Placement Confirmation: positive ETCO2 and breath sounds checked- equal and bilateral Secured at: 21 cm Tube secured with: Tape Dental Injury: Teeth and Oropharynx as per pre-operative assessment       Right IJ vein with needle in place.

## 2015-03-31 NOTE — Anesthesia Postprocedure Evaluation (Signed)
Anesthesia Post Note  Patient: Kenneth Vance  Procedure(s) Performed: Procedure(s) (LRB): CORONARY ARTERY BYPASS GRAFTING (CABG) X 4 UTILIZING THE LEFT INTERNAL MAMMARY ARTERY  AND RIGHT SAPHENEOUS VEINS . (N/A) TRANSESOPHAGEAL ECHOCARDIOGRAM (TEE) (N/A)  Patient location during evaluation: SICU Anesthesia Type: General Level of consciousness: sedated Pain management: pain level controlled Vital Signs Assessment: post-procedure vital signs reviewed and stable Respiratory status: patient remains intubated per anesthesia plan Cardiovascular status: stable Anesthetic complications: no    Last Vitals:  Filed Vitals:   03/31/15 0402 03/31/15 0500  BP:  121/85  Pulse:    Temp: 36.7 C   Resp:  16    Last Pain:  Filed Vitals:   03/31/15 0542  PainSc: 0-No pain                 Zenaida Deed

## 2015-03-31 NOTE — Procedures (Signed)
Extubation Procedure Note  Patient Details:   Name: Kenneth Vance DOB: 1956-02-11 MRN: [id number]   Airway Documentation:     Evaluation  O2 sats: stable throughout Complications: No apparent complications Patient did tolerate procedure well. Bilateral Breath Sounds: Clear Suctioning: Airway Yes  Pt began coughing and pushed tube balloon above vocal cords.  Unable to correct and proceeded with extubation.  No stridor noted.  RN present.  Donnetta Hail 03/31/2015, 4:17 PM

## 2015-03-31 NOTE — Anesthesia Preprocedure Evaluation (Addendum)
Anesthesia Evaluation  Patient identified by MRN, date of birth, ID band Patient awake    Reviewed: Allergy & Precautions, H&P , NPO status , Patient's Chart, lab work & pertinent test results  History of Anesthesia Complications Negative for: history of anesthetic complications  Airway Mallampati: II  TM Distance: >3 FB Neck ROM: full    Dental no notable dental hx. (+) Teeth Intact, Dental Advisory Given   Pulmonary neg pulmonary ROS,    Pulmonary exam normal breath sounds clear to auscultation       Cardiovascular hypertension, Pt. on medications + angina with exertion + CAD  Normal cardiovascular exam Rhythm:regular Rate:Normal     Neuro/Psych negative neurological ROS     GI/Hepatic negative GI ROS, Neg liver ROS,   Endo/Other  negative endocrine ROS  Renal/GU negative Renal ROS     Musculoskeletal   Abdominal   Peds  Hematology negative hematology ROS (+)   Anesthesia Other Findings   Reproductive/Obstetrics negative OB ROS                            Anesthesia Physical Anesthesia Plan  ASA: IV  Anesthesia Plan: General   Post-op Pain Management:    Induction: Intravenous  Airway Management Planned: Oral ETT  Additional Equipment: PA Cath, Arterial line, CVP, TEE and Ultrasound Guidance Line Placement  Intra-op Plan:   Post-operative Plan: Post-operative intubation/ventilation  Informed Consent: I have reviewed the patients History and Physical, chart, labs and discussed the procedure including the risks, benefits and alternatives for the proposed anesthesia with the patient or authorized representative who has indicated his/her understanding and acceptance.   Dental Advisory Given  Plan Discussed with: Anesthesiologist, CRNA and Surgeon  Anesthesia Plan Comments:         Anesthesia Quick Evaluation

## 2015-03-31 NOTE — Transfer of Care (Signed)
Immediate Anesthesia Transfer of Care Note  Patient: Kenneth Vance  Procedure(s) Performed: Procedure(s): CORONARY ARTERY BYPASS GRAFTING (CABG) X 4 UTILIZING THE LEFT INTERNAL MAMMARY ARTERY  AND RIGHT SAPHENEOUS VEINS . (N/A) TRANSESOPHAGEAL ECHOCARDIOGRAM (TEE) (N/A)  Patient Location: SICU  Anesthesia Type:General  Level of Consciousness: sedated  Airway & Oxygen Therapy: Patient remains intubated per anesthesia plan  Post-op Assessment: Report given to RN and Post -op Vital signs reviewed and stable  Post vital signs: Reviewed and stable  Last Vitals:  Filed Vitals:   03/31/15 0402 03/31/15 0500  BP:  121/85  Pulse:    Temp: 36.7 C   Resp:  16    Complications: No apparent anesthesia complications

## 2015-03-31 NOTE — Brief Op Note (Signed)
03/29/2015 - 03/31/2015      [address]       [place] [address]             [phone]     03/29/2015 - 03/31/2015  11:29 AM  PATIENT:  Kenneth Vance  59 y.o. male  PRE-OPERATIVE DIAGNOSIS:  CAD  POST-OPERATIVE DIAGNOSIS:  CAD  PROCEDURE:  Procedure(s): CORONARY ARTERY BYPASS GRAFTING (CABG) X 4 LIMA-LAD; SVG-RAMUS; SVG -OM; SVG-AM TRANSESOPHAGEAL ECHOCARDIOGRAM (TEE) ENDOSCOPIC GREATER SAPHENOUS VEIN FROM RIGHT LEG([facility])  SURGEON:  Surgeon(s): Gaye Pollack, MD  PHYSICIAN ASSISTANT: Taino Maertens PA-C   ANESTHESIA:   general  PATIENT CONDITION:  ICU - intubated and hemodynamically stable.  PRE-OPERATIVE WEIGHT: 0000000  COMPLICATIONS: NO KNOWN  EBL: SEE ANEST/PERFUSION RECORDS

## 2015-03-31 NOTE — Progress Notes (Signed)
  Echocardiogram Echocardiogram Transesophageal has been performed.  Jennette Dubin 03/31/2015, 9:32 AM

## 2015-04-01 ENCOUNTER — Inpatient Hospital Stay (HOSPITAL_COMMUNITY): Payer: BLUE CROSS/BLUE SHIELD

## 2015-04-01 DIAGNOSIS — I209 Angina pectoris, unspecified: Secondary | ICD-10-CM

## 2015-04-01 LAB — BASIC METABOLIC PANEL
Anion gap: 6 (ref 5–15)
BUN: 10 mg/dL (ref 6–20)
CALCIUM: 8.5 mg/dL — AB (ref 8.9–10.3)
CO2: 26 mmol/L (ref 22–32)
CREATININE: 0.93 mg/dL (ref 0.61–1.24)
Chloride: 106 mmol/L (ref 101–111)
GFR calc Af Amer: >60 mL/min (ref >60)
GLUCOSE: 112 mg/dL — AB (ref 65–99)
Potassium: 4.1 mmol/L (ref 3.5–5.1)
Sodium: 138 mmol/L (ref 135–145)

## 2015-04-01 LAB — CBC
HEMATOCRIT: 31.6 % — AB (ref 39.0–52.0)
HEMATOCRIT: 30.6 % — AB (ref 39.0–52.0)
HEMOGLOBIN: 10.5 g/dL — AB (ref 13.0–17.0)
Hemoglobin: 10.5 g/dL — ABNORMAL LOW (ref 13.0–17.0)
MCH: 29.7 pg (ref 26.0–34.0)
MCH: 30.9 pg (ref 26.0–34.0)
MCHC: 33.2 g/dL (ref 30.0–36.0)
MCHC: 34.3 g/dL (ref 30.0–36.0)
MCV: 89.3 fL (ref 78.0–100.0)
MCV: 90 fL (ref 78.0–100.0)
PLATELETS: 123 10*3/uL — AB (ref 150–400)
Platelets: 135 10*3/uL — ABNORMAL LOW (ref 150–400)
RBC: 3.54 MIL/uL — ABNORMAL LOW (ref 4.22–5.81)
RBC: 3.4 MIL/uL — ABNORMAL LOW (ref 4.22–5.81)
RDW: 12.3 % (ref 11.5–15.5)
RDW: 12.5 % (ref 11.5–15.5)
WBC: 7.8 10*3/uL (ref 4.0–10.5)
WBC: 10.2 10*3/uL (ref 4.0–10.5)

## 2015-04-01 LAB — GLUCOSE, CAPILLARY
GLUCOSE-CAPILLARY: 132 mg/dL — AB (ref 65–99)
GLUCOSE-CAPILLARY: 127 mg/dL — AB (ref 65–99)
GLUCOSE-CAPILLARY: 114 mg/dL — AB (ref 65–99)
GLUCOSE-CAPILLARY: 114 mg/dL — AB (ref 65–99)
GLUCOSE-CAPILLARY: 105 mg/dL — AB (ref 65–99)
GLUCOSE-CAPILLARY: 103 mg/dL — AB (ref 65–99)
GLUCOSE-CAPILLARY: 95 mg/dL (ref 65–99)
GLUCOSE-CAPILLARY: 123 mg/dL — AB (ref 65–99)
Glucose-Capillary: 114 mg/dL — ABNORMAL HIGH (ref 65–99)
Glucose-Capillary: 123 mg/dL — ABNORMAL HIGH (ref 65–99)
Glucose-Capillary: 125 mg/dL — ABNORMAL HIGH (ref 65–99)
Glucose-Capillary: 138 mg/dL — ABNORMAL HIGH (ref 65–99)
Glucose-Capillary: 139 mg/dL — ABNORMAL HIGH (ref 65–99)
Glucose-Capillary: 108 mg/dL — ABNORMAL HIGH (ref 65–99)
Glucose-Capillary: 86 mg/dL (ref 65–99)
Glucose-Capillary: 125 mg/dL — ABNORMAL HIGH (ref 65–99)

## 2015-04-01 LAB — POCT I-STAT, CHEM 8
BUN: 13 mg/dL (ref 6–20)
CALCIUM ION: 1.18 mmol/L (ref 1.12–1.23)
CHLORIDE: 100 mmol/L — AB (ref 101–111)
CREATININE: 1.1 mg/dL (ref 0.61–1.24)
Glucose, Bld: 128 mg/dL — ABNORMAL HIGH (ref 65–99)
HEMATOCRIT: 31 % — AB (ref 39.0–52.0)
Hemoglobin: 10.5 g/dL — ABNORMAL LOW (ref 13.0–17.0)
Potassium: 4.1 mmol/L (ref 3.5–5.1)
SODIUM: 138 mmol/L (ref 135–145)
TCO2: 25 mmol/L (ref 0–100)

## 2015-04-01 LAB — MAGNESIUM
Magnesium: 1.9 mg/dL (ref 1.7–2.4)
Magnesium: 2 mg/dL (ref 1.7–2.4)

## 2015-04-01 LAB — CREATININE, SERUM: CREATININE: 1.18 mg/dL (ref 0.61–1.24)

## 2015-04-01 LAB — HEMOGLOBIN A1C
Hgb A1c MFr Bld: 5.3 % (ref 4.8–5.6)
Mean Plasma Glucose: 105 mg/dL

## 2015-04-01 MED ORDER — FUROSEMIDE 10 MG/ML IJ SOLN
20.0000 mg | Freq: Once | INTRAMUSCULAR | Status: AC
  Administered 2015-04-01: 20 mg via INTRAVENOUS
  Filled 2015-04-01: qty 2

## 2015-04-01 MED ORDER — INSULIN ASPART 100 UNIT/ML ~~LOC~~ SOLN
0.0000 [IU] | SUBCUTANEOUS | Status: DC
  Administered 2015-04-01 (×2): 2 [IU] via SUBCUTANEOUS

## 2015-04-01 MED ORDER — MORPHINE SULFATE (PF) 2 MG/ML IV SOLN
2.0000 mg | INTRAVENOUS | Status: DC | PRN
  Administered 2015-04-01 – 2015-04-02 (×3): 2 mg via INTRAVENOUS
  Filled 2015-04-01 (×3): qty 1

## 2015-04-01 NOTE — Progress Notes (Signed)
[facility] BRIEF SICU PROGRESS NOTE  1 Day Post-Op  S/P Procedure(s) (LRB): CORONARY ARTERY BYPASS GRAFTING (CABG) X 4 UTILIZING THE LEFT INTERNAL MAMMARY ARTERY  AND RIGHT SAPHENEOUS VEINS . (N/A) TRANSESOPHAGEAL ECHOCARDIOGRAM (TEE) (N/A)   Stable day NSR w/ stable BP UOP adequate Labs okay  Plan: Continue routine care  Rexene Alberts, MD 04/01/2015 4:55 PM

## 2015-04-01 NOTE — Progress Notes (Addendum)
[address]       [place] [address]             [phone]        CARDIOTHORACIC SURGERY PROGRESS NOTE   R1 Day Post-Op Procedure(s) (LRB): CORONARY ARTERY BYPASS GRAFTING (CABG) X 4 UTILIZING THE LEFT INTERNAL MAMMARY ARTERY  AND RIGHT SAPHENEOUS VEINS . (N/A) TRANSESOPHAGEAL ECHOCARDIOGRAM (TEE) (N/A)  Subjective: Looks good.  Feels sore in chest.  Objective: Vital signs: BP Readings from Last 1 Encounters:  04/01/15 99/63   Pulse Readings from Last 1 Encounters:  04/01/15 89   Resp Readings from Last 1 Encounters:  04/01/15 21   Temp Readings from Last 1 Encounters:  04/01/15 98.8 F (37.1 C) Oral    Hemodynamics: PAP: (8-23)/(0-11) 13/3 mmHg CO:  [3.5 L/min-5.5 L/min] 4.4 L/min CI:  [1.6 L/min/m2-2.6 L/min/m2] 2.1 L/min/m2  Physical Exam:  Rhythm:   sinus  Breath sounds: clear  Heart sounds:  RRR  Incisions:  Dressing dry, intact  Abdomen:  Soft, non-distended, non-tender  Extremities:  Warm, well-perfused  Chest tubes:  Low volume thin serosanguinous output, no air leak    Intake/Output from previous day: 12/02 0701 - 12/03 0700 In: 4656.6 [I.V.:3026.6; NG/GT:30; IV Piggyback:1600] Out: 4485 [Urine:3255; Blood:650; Chest Tube:580] Intake/Output this shift: Total I/O In: 90.1 [I.V.:90.1] Out: 525 [Urine:285; Chest Tube:240]  Lab Results:  CBC: Recent Labs  03/31/15 1918 03/31/15 1919 04/01/15 0419  WBC 10.2  --  7.8  HGB 11.3* 11.2* 10.5*  HCT 33.3* 33.0* 31.6*  PLT 120*  --  123*    BMET:  Recent Labs  03/31/15 0224  03/31/15 1919 04/01/15 0419  NA 139  < > 141 138  K 4.2  < > 4.3 4.1  CL 102  < > 103 106  CO2 28  --   --  26  GLUCOSE 104*  < > 138* 112*  BUN 13  < > 11 10  CREATININE 1.10  < > 0.90 0.93  CALCIUM 9.3  --   --  8.5*  < > = values in this interval not displayed.   PT/INR:   Recent Labs  03/31/15 1343  LABPROT 16.4*  INR 1.31    CBG (last 3)   Recent Labs  04/01/15 0316  04/01/15 0406 04/01/15 0810  GLUCAP 105* 103* 86    ABG    Component Value Date/Time   PHART 7.411 03/31/2015 1724   PCO2ART 40.0 03/31/2015 1724   PO2ART 144.0* 03/31/2015 1724   HCO3 25.4* 03/31/2015 1724   TCO2 27 03/31/2015 1919   O2SAT 99.0 03/31/2015 1724    CXR: PORTABLE CHEST 1 VIEW  COMPARISON: Yesterday  FINDINGS: Swan-Ganz catheter removal with tracheal and esophageal extubation. Stable postoperative heart size and mediastinal contours status post CABG. Stable bibasilar atelectasis. No significant pleural effusion. No pneumothorax.  IMPRESSION: Stable postoperative atelectasis. Remaining tubes and line in stable position.   Electronically Signed  By: Monte Fantasia M.D.  On: 04/01/2015 08:22  Assessment/Plan: S/P Procedure(s) (LRB): CORONARY ARTERY BYPASS GRAFTING (CABG) X 4 UTILIZING THE LEFT INTERNAL MAMMARY ARTERY  AND RIGHT SAPHENEOUS VEINS . (N/A) TRANSESOPHAGEAL ECHOCARDIOGRAM (TEE) (N/A)  Doing well POD1 Maintaining NSR w/ stable BP off all drips O2 sats 100% on 2 L/min Expected post op acute blood loss anemia, mild Expected post op volume excess, mild Expected post op atelectasis, mild Post op thrombocytopenia, mild Hyperlipidemia - intolerant of statins   Mobilize  D/C tubes and lines  Diuresis   Rexene Alberts, MD 04/01/2015 10:34 AM

## 2015-04-02 ENCOUNTER — Inpatient Hospital Stay (HOSPITAL_COMMUNITY): Payer: BLUE CROSS/BLUE SHIELD

## 2015-04-02 DIAGNOSIS — I2510 Atherosclerotic heart disease of native coronary artery without angina pectoris: Secondary | ICD-10-CM | POA: Insufficient documentation

## 2015-04-02 LAB — GLUCOSE, CAPILLARY
GLUCOSE-CAPILLARY: 130 mg/dL — AB (ref 65–99)
Glucose-Capillary: 114 mg/dL — ABNORMAL HIGH (ref 65–99)
Glucose-Capillary: 102 mg/dL — ABNORMAL HIGH (ref 65–99)
Glucose-Capillary: 110 mg/dL — ABNORMAL HIGH (ref 65–99)

## 2015-04-02 LAB — CBC
HCT: 30 % — ABNORMAL LOW (ref 39.0–52.0)
Hemoglobin: 9.9 g/dL — ABNORMAL LOW (ref 13.0–17.0)
MCH: 30.1 pg (ref 26.0–34.0)
MCHC: 33 g/dL (ref 30.0–36.0)
MCV: 91.2 fL (ref 78.0–100.0)
PLATELETS: 126 10*3/uL — AB (ref 150–400)
RBC: 3.29 MIL/uL — ABNORMAL LOW (ref 4.22–5.81)
RDW: 12.6 % (ref 11.5–15.5)
WBC: 8.6 10*3/uL (ref 4.0–10.5)

## 2015-04-02 LAB — BASIC METABOLIC PANEL
Anion gap: 5 (ref 5–15)
BUN: 14 mg/dL (ref 6–20)
CHLORIDE: 103 mmol/L (ref 101–111)
CO2: 28 mmol/L (ref 22–32)
CREATININE: 1.21 mg/dL (ref 0.61–1.24)
Calcium: 8.6 mg/dL — ABNORMAL LOW (ref 8.9–10.3)
GFR calc Af Amer: >60 mL/min (ref >60)
GFR calc non Af Amer: >60 mL/min (ref >60)
GLUCOSE: 113 mg/dL — AB (ref 65–99)
Potassium: 4 mmol/L (ref 3.5–5.1)
Sodium: 136 mmol/L (ref 135–145)

## 2015-04-02 MED ORDER — MOVING RIGHT ALONG BOOK
Freq: Once | Status: AC
  Administered 2015-04-02: 1
  Filled 2015-04-02: qty 1

## 2015-04-02 MED ORDER — FUROSEMIDE 40 MG PO TABS
40.0000 mg | ORAL_TABLET | Freq: Every day | ORAL | Status: AC
  Administered 2015-04-02 – 2015-04-04 (×3): 40 mg via ORAL
  Filled 2015-04-02 (×3): qty 1

## 2015-04-02 MED ORDER — POTASSIUM CHLORIDE CRYS ER 20 MEQ PO TBCR
20.0000 meq | EXTENDED_RELEASE_TABLET | Freq: Every day | ORAL | Status: AC
  Administered 2015-04-03: 20 meq via ORAL
  Filled 2015-04-02: qty 1

## 2015-04-02 MED ORDER — SODIUM CHLORIDE 0.9 % IJ SOLN
3.0000 mL | Freq: Two times a day (BID) | INTRAMUSCULAR | Status: DC
  Administered 2015-04-02 – 2015-04-04 (×4): 3 mL via INTRAVENOUS

## 2015-04-02 MED ORDER — SODIUM CHLORIDE 0.9 % IV SOLN: 250.0000 mL | INTRAVENOUS | Status: DC | PRN

## 2015-04-02 MED ORDER — SODIUM CHLORIDE 0.9 % IJ SOLN: 3.0000 mL | INTRAMUSCULAR | Status: DC | PRN

## 2015-04-02 NOTE — Progress Notes (Signed)
      [address]       [place] [address]             [phone]        CARDIOTHORACIC SURGERY PROGRESS NOTE   R2 Days Post-Op Procedure(s) (LRB): CORONARY ARTERY BYPASS GRAFTING (CABG) X 4 UTILIZING THE LEFT INTERNAL MAMMARY ARTERY  AND RIGHT SAPHENEOUS VEINS . (N/A) TRANSESOPHAGEAL ECHOCARDIOGRAM (TEE) (N/A)  Subjective: Looks good.  Mild soreness in chest.   Appetite improved.  Objective: Vital signs: BP Readings from Last 1 Encounters:  04/02/15 98/82   Pulse Readings from Last 1 Encounters:  04/02/15 66   Resp Readings from Last 1 Encounters:  04/02/15 16   Temp Readings from Last 1 Encounters:  04/02/15 98.3 F (36.8 C) Oral    Hemodynamics:    Physical Exam:  Rhythm:   sinus  Breath sounds: clear  Heart sounds:  RRR  Incisions:  Clean and dry  Abdomen:  Soft, non-distended, non-tender  Extremities:  Warm, well-perfused  Chest tubes:  Low volume thin serosanguinous output, no air leak    Intake/Output from previous day: 12/03 0701 - 12/04 0700 In: 1090.1 [P.O.:660; I.V.:330.1; IV Piggyback:100] Out: [id number] [Urine:1230; Chest Tube:360] Intake/Output this shift: Total I/O In: 290 [P.O.:240; I.V.:50] Out: 115 [Urine:85; Chest Tube:30]  Lab Results:  CBC: Recent Labs  04/01/15 1700 04/02/15 0410  WBC 10.2 8.6  HGB 10.5*  10.5* 9.9*  HCT 30.6*  31.0* 30.0*  PLT 135* 126*    BMET:  Recent Labs  04/01/15 0419 04/01/15 1700 04/02/15 0410  NA 138 138 136  K 4.1 4.1 4.0  CL 106 100* 103  CO2 26  --  28  GLUCOSE 112* 128* 113*  BUN 10 13 14   CREATININE 0.93 1.18  1.10 1.21  CALCIUM 8.5*  --  8.6*     PT/INR:   Recent Labs  03/31/15 1343  LABPROT 16.4*  INR 1.31    CBG (last 3)   Recent Labs  04/02/15 0409 04/02/15 0741 04/02/15 1159  GLUCAP 114* 102* 130*    ABG    Component Value Date/Time   PHART 7.411 03/31/2015 1724   PCO2ART 40.0 03/31/2015 1724   PO2ART 144.0* 03/31/2015 1724   HCO3 25.4*  03/31/2015 1724   TCO2 25 04/01/2015 1700   O2SAT 99.0 03/31/2015 1724    CXR: PORTABLE CHEST 1 VIEW  COMPARISON: 03/31/2015 and 04/01/2015.  FINDINGS: 0516 hours. Right IJ sheath, left chest tube and mediastinal drains are unchanged. The heart size and mediastinal contours are stable. There is stable bibasilar atelectasis. No evidence of pneumothorax or significant pleural effusion.  IMPRESSION: Stable postoperative chest with mild bibasilar atelectasis. No evidence of pneumothorax.   Electronically Signed  By: Richardean Sale M.D.  On: 04/02/2015 07:50   Assessment/Plan: S/P Procedure(s) (LRB): CORONARY ARTERY BYPASS GRAFTING (CABG) X 4 UTILIZING THE LEFT INTERNAL MAMMARY ARTERY  AND RIGHT SAPHENEOUS VEINS . (N/A) TRANSESOPHAGEAL ECHOCARDIOGRAM (TEE) (N/A)  Doing well POD2 Maintaining NSR w/ stable BP  O2 sats 96% on room air Expected post op acute blood loss anemia, mild Expected post op volume excess, mild Expected post op atelectasis, mild Post op thrombocytopenia, mild Hyperlipidemia - intolerant of statins   Mobilize  D/C tubes and lines  Diuresis  Transfer 2W  Rexene Alberts, MD 04/02/2015 12:19 PM

## 2015-04-02 NOTE — Progress Notes (Signed)
Stable

## 2015-04-03 ENCOUNTER — Inpatient Hospital Stay (HOSPITAL_COMMUNITY): Payer: BLUE CROSS/BLUE SHIELD

## 2015-04-03 ENCOUNTER — Ambulatory Visit: Payer: BLUE CROSS/BLUE SHIELD | Admitting: Pharmacist

## 2015-04-03 ENCOUNTER — Encounter (HOSPITAL_COMMUNITY): Payer: Self-pay | Admitting: Surgery

## 2015-04-03 LAB — BASIC METABOLIC PANEL
Anion gap: 7 (ref 5–15)
BUN: 14 mg/dL (ref 6–20)
CALCIUM: 8.6 mg/dL — AB (ref 8.9–10.3)
CO2: 30 mmol/L (ref 22–32)
CREATININE: 1.14 mg/dL (ref 0.61–1.24)
Chloride: 101 mmol/L (ref 101–111)
GFR calc non Af Amer: >60 mL/min (ref >60)
GLUCOSE: 112 mg/dL — AB (ref 65–99)
Potassium: 3.7 mmol/L (ref 3.5–5.1)
Sodium: 138 mmol/L (ref 135–145)

## 2015-04-03 LAB — CBC
HEMATOCRIT: 29.4 % — AB (ref 39.0–52.0)
Hemoglobin: 9.7 g/dL — ABNORMAL LOW (ref 13.0–17.0)
MCH: 30 pg (ref 26.0–34.0)
MCHC: 33 g/dL (ref 30.0–36.0)
MCV: 91 fL (ref 78.0–100.0)
Platelets: 136 10*3/uL — ABNORMAL LOW (ref 150–400)
RBC: 3.23 MIL/uL — ABNORMAL LOW (ref 4.22–5.81)
RDW: 12.5 % (ref 11.5–15.5)
WBC: 7.2 10*3/uL (ref 4.0–10.5)

## 2015-04-03 LAB — POCT I-STAT GLUCOSE
GLUCOSE: 103 mg/dL — AB (ref 65–99)
Operator id: 190282

## 2015-04-03 MED ORDER — POTASSIUM CHLORIDE CRYS ER 20 MEQ PO TBCR
40.0000 meq | EXTENDED_RELEASE_TABLET | Freq: Once | ORAL | Status: AC
  Administered 2015-04-04: 40 meq via ORAL
  Filled 2015-04-03: qty 2

## 2015-04-03 MED FILL — Dexmedetomidine HCl in NaCl 0.9% IV Soln 400 MCG/100ML: INTRAVENOUS | Qty: 100 | Status: AC

## 2015-04-03 MED FILL — Sodium Chloride IV Soln 0.9%: INTRAVENOUS | Qty: 2000 | Status: AC

## 2015-04-03 MED FILL — Electrolyte-R (PH 7.4) Solution: INTRAVENOUS | Qty: 3000 | Status: AC

## 2015-04-03 MED FILL — Sodium Bicarbonate IV Soln 8.4%: INTRAVENOUS | Qty: 50 | Status: AC

## 2015-04-03 MED FILL — Heparin Sodium (Porcine) Inj 1000 Unit/ML: INTRAMUSCULAR | Qty: 10 | Status: AC

## 2015-04-03 MED FILL — Lidocaine HCl IV Inj 20 MG/ML: INTRAVENOUS | Qty: 5 | Status: AC

## 2015-04-03 NOTE — Progress Notes (Signed)
Patient ID: Kenneth Vance, male   DOB: Aug 31, 1955, 59 y.o.   MRN: [id number] EVENING ROUNDS NOTE :     [address]       [place] [address]             [phone]                 3 Days Post-Op Procedure(s) (LRB): CORONARY ARTERY BYPASS GRAFTING (CABG) X 4 UTILIZING THE LEFT INTERNAL MAMMARY ARTERY  AND RIGHT SAPHENEOUS VEINS . (N/A) TRANSESOPHAGEAL ECHOCARDIOGRAM (TEE) (N/A)  Total Length of Stay:  LOS: 5 days  BP 128/76 mmHg  Pulse 72  Temp(Src) 98.9 F (37.2 C) (Oral)  Resp 16  Ht 6\' 1"  (1.854 m)  Wt 207 lb 0.2 oz (93.9 kg)  BMI 27.32 kg/m2  SpO2 98%  .Intake/Output      12/04 0701 - 12/05 0700 12/05 0701 - 12/06 0700   P.O. 720 360   I.V. (mL/kg) 60 (0.6)    IV Piggyback     Total Intake(mL/kg) 780 (8.3) 360 (3.8)   Urine (mL/kg/hr) 210 (0.1)    Chest Tube 415 (0.2)    Total Output 625     Net +155 +360        Urine Occurrence 875 x 1075 x     . sodium chloride       Lab Results  Component Value Date   WBC 7.2 04/03/2015   HGB 9.7* 04/03/2015   HCT 29.4* 04/03/2015   PLT 136* 04/03/2015   GLUCOSE 112* 04/03/2015   CHOL 258* 03/28/2015   TRIG 195* 03/28/2015   HDL 46 03/28/2015   LDLCALC 173* 03/28/2015   ALT 15 03/28/2015   AST 14 03/28/2015   NA 138 04/03/2015   K 3.7 04/03/2015   CL 101 04/03/2015   CREATININE 1.14 04/03/2015   BUN 14 04/03/2015   CO2 30 04/03/2015   INR 1.31 03/31/2015   HGBA1C 5.3 03/31/2015   Waiting for 2w bed  Grace Isaac MD  Beeper [phone] Office [phone] 04/03/2015 6:46 PM

## 2015-04-03 NOTE — Care Management Note (Signed)
Case Management Note  Patient Details  Name: Kenneth Vance MRN: [id number] Date of Birth: 1956/03/18  Subjective/Objective:       Lives at home with wife and children.  Independent prior.  Sitting up in chair eating lunch.  Plan for discharge is home with family who will be with him 24/7 for first 7-10 days.              Action/Plan:   Expected Discharge Date:                  Expected Discharge Plan:  Home/Self Care  In-House Referral:     Discharge planning Services  CM Consult  Post Acute Care Choice:    Choice offered to:     DME Arranged:    DME Agency:     HH Arranged:    HH Agency:     Status of Service:  In process, will continue to follow  Medicare Important Message Given:    Date Medicare IM Given:    Medicare IM give by:    Date Additional Medicare IM Given:    Additional Medicare Important Message give by:     If discussed at [place] of Stay Meetings, dates discussed:    Additional Comments:  Vergie Living, RN 04/03/2015, 1:39 PM

## 2015-04-03 NOTE — Progress Notes (Signed)
3 Days Post-Op Procedure(s) (LRB): CORONARY ARTERY BYPASS GRAFTING (CABG) X 4 UTILIZING THE LEFT INTERNAL MAMMARY ARTERY  AND RIGHT SAPHENEOUS VEINS . (N/A) TRANSESOPHAGEAL ECHOCARDIOGRAM (TEE) (N/A) Subjective:  No complaints  Objective: Vital signs in last 24 hours: Temp:  [97.9 F (36.6 C)-98.8 F (37.1 C)] 98.3 F (36.8 C) (12/05 0740) Pulse Rate:  [64-84] 78 (12/05 0700) Cardiac Rhythm:  [-] Normal sinus rhythm (12/05 0745) Resp:  [13-24] 19 (12/05 0700) BP: (89-137)/(52-82) 95/57 mmHg (12/05 0400) SpO2:  [90 %-99 %] 97 % (12/05 0700) Weight:  [93.9 kg (207 lb 0.2 oz)] 93.9 kg (207 lb 0.2 oz) (12/05 0600)  Hemodynamic parameters for last 24 hours:    Intake/Output from previous day: 12/04 0701 - 12/05 0700 In: 780 [P.O.:720; I.V.:60] Out: 625 [Urine:210; Chest Tube:415] Intake/Output this shift:    General appearance: alert and cooperative Heart: regular rate and rhythm, S1, S2 normal, no murmur, click, rub or gallop Lungs: clear to auscultation bilaterally Wound: dressing dry  Lab Results:  Recent Labs  04/02/15 0410 04/03/15 0230  WBC 8.6 7.2  HGB 9.9* 9.7*  HCT 30.0* 29.4*  PLT 126* 136*   BMET:  Recent Labs  04/02/15 0410 04/03/15 0230  NA 136 138  K 4.0 3.7  CL 103 101  CO2 28 30  GLUCOSE 113* 112*  BUN 14 14  CREATININE 1.21 1.14  CALCIUM 8.6* 8.6*    PT/INR:  Recent Labs  03/31/15 1343  LABPROT 16.4*  INR 1.31   ABG    Component Value Date/Time   PHART 7.411 03/31/2015 1724   HCO3 25.4* 03/31/2015 1724   TCO2 25 04/01/2015 1700   O2SAT 99.0 03/31/2015 1724   CBG (last 3)   Recent Labs  04/02/15 0409 04/02/15 0741 04/02/15 1159  GLUCAP 114* 102* 130*   CLINICAL DATA: Atelectasis. Postop CABG  EXAM: CHEST 2 VIEW  COMPARISON: Yesterday  FINDINGS: Aside from direct EKG leads, support apparatus has been removed. There is no pneumothorax after chest tube removal. Linear atelectasis at the bases is stable. Trace  pleural effusions. Anterior pneumomediastinum, expected after chest tube removal. No pulmonary edema. Stable heart size and mediastinal contours after CABG.  IMPRESSION: 1. Pneumomediastinum but no pneumothorax after thoracic drain removal. 2. Stable basilar atelectasis and trace effusions.   Electronically Signed  By: Monte Fantasia M.D.  On: 04/03/2015 07:20   Assessment/Plan: S/P Procedure(s) (LRB): CORONARY ARTERY BYPASS GRAFTING (CABG) X 4 UTILIZING THE LEFT INTERNAL MAMMARY ARTERY  AND RIGHT SAPHENEOUS VEINS . (N/A) TRANSESOPHAGEAL ECHOCARDIOGRAM (TEE) (N/A) Mobilize Diuresis: weight is 6 lbs over preop awaiting bed on 2W telemetry   LOS: 5 days    Gaye Pollack 04/03/2015

## 2015-04-04 DIAGNOSIS — I2510 Atherosclerotic heart disease of native coronary artery without angina pectoris: Secondary | ICD-10-CM

## 2015-04-04 MED ORDER — LACTULOSE 10 GM/15ML PO SOLN
20.0000 g | Freq: Every day | ORAL | Status: DC | PRN
  Filled 2015-04-04: qty 30

## 2015-04-04 MED ORDER — ACETAMINOPHEN 500 MG PO TABS: 1000.0000 mg | ORAL_TABLET | Freq: Four times a day (QID) | ORAL | Status: DC | PRN

## 2015-04-04 NOTE — Progress Notes (Signed)
[id number] Pt had walked twice already and plans to walk again with wife later. Pt had questions re ex ed and CRP 2 so these were discussed. Gave written ex ed and discussed CRP 2. Will refer to [facility] with pt's permission. Left diet for pt to read. We will follow up tomorrow to complete ed when wife here. Graylon Good RN BSN 04/04/2015 1:49 PM

## 2015-04-04 NOTE — Discharge Summary (Signed)
Physician Discharge Summary       [address]       [place] [address]             [phone]    Patient ID: Kenneth SCHOMBERG MRN: [id number] DOB/AGE: [birth year] 59 y.o.  Admit date: 03/29/2015 Discharge date: 04/04/2015  Admission Diagnoses: 1. Unstable angina 2. Multivessel CAD 3. Hyperlipidemia (intolerant to statins)  Active Diagnoses:  1. Essential hypertension 2. BCC of the ear 3. ABL anemia 4. Mild thrombocytopenia   Patient Active Problem List   Diagnosis Date Noted  . Coronary artery disease involving native coronary artery of native heart without angina pectoris   . CAD (coronary artery disease)   . S/P CABG x 4 03/31/2015  . CAD, multiple vessel: Ost-Prox LAD 90%, RI & OM1 ost 90%, rPDA ost 90% 03/30/2015  . Angina pectoris ([place]) 03/29/2015  . Angina, class II ([place]) - progressive 03/28/2015  . Hyperlipidemia with target LDL less than 70; Cramping with Statins 03/28/2015  . Essential hypertension 03/28/2015   Procedure (s):  Cardiac catheterization done by Dr. Irish Lack on 11/30/2016[place] RPDA to RPDA lesion, 90% stenosed. High bifurcation of PDA and PLA. PLA is free of severe disease.  Ost Ramus lesion, 90% stenosed.  Ost 1st Mrg to 1st Mrg lesion, 90% stenosed.  Ost LAD to Prox LAD lesion, 90% stenosed.  The left ventricular systolic function is normal.  Severe three vessel disease which would be best treated with CABG. Will admit the patient. He will need consideration for PCSK9 inhibitor in the future due to his statin intolerance.  Median Sternotomy  Extracorporeal circulation 3. Coronary artery bypass grafting x 4   Left internal mammary graft to the LAD  SVG to Ramus  SVG to OM  SVG to PDA 4. Endoscopic vein harvest from the right leg by Dr. Cyndia Bent on 03/31/2015.  History of Presenting Illness This is a 59 year old gentleman with hyperlipidemia who has not tolerated any medications for this and a strong family  history of coronary artery disease on both sides of his family who reports about a one month history of exertional chest discomfort. This has only occurred with significant exertion like brisk walks or going up hills or stairs. He has been walking more trying to get back in shape but has noticed this chest discomfort. Sometimes he feels like he can walk through it and has had periods where he feels stronger with more stamina but then it recurs. Cardiac catheterization done on 11/30 showed severe multi-vessel coronary disease with a long 90% ostial to proximal LAD stenosis. The LCX has a small ramus branch with 90% stenosis and the OM1 has a 90% stenosis. The RPDA has a 90% stenosis. LV function is normal.  He lives in [place] with his wife. 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   Dr. Cyndia Bent was consulted for the consideration of coronary artery bypass grafting surgery. Potential risks, benefits, and complications of the surgery were discussed with the patient and he agreed to proceed with surgery. Pre operative carotid duplex US showed no significant internal carotid artery stenosis bilaterally. He underwent a CABG x 4 on 03/31/2015.  Brief Hospital Course:  The patient was extubated the afternoon of surgery without difficulty. Heremained afebrile and hemodynamically stable. Gordy Councilman, a line, chest tubes, and foley were removed early in the post operative course. Lopressor was started and titrated accordingly. He was volume over loaded and diuresed. He  had ABL anemia. He did not require a post op transfusion. His last H and H was 9.7 and 29.4. He also had mild thrombocytopenia. His last platelet count was up to 136,000. He was weaned off the insulin drip. The patient's glucose remained well controlled. The patient's HGA1C pre op was 5.3. The patient has been surgically stable for transfer to [facility], but there are no beds available yet;therefore, he remains in ICU. He  continues to progress with cardiac rehab. He was ambulating on room air. He has been tolerating a diet and has had a bowel movement. Epicardial pacing wires were removed on 12/6. Chest tube sutures will be removed in the office after discharge . The patient is felt surgically stable for discharge today.   Latest Vital Signs: Blood pressure 100/66, pulse 69, temperature 98.6 F (37 C), temperature source Oral, resp. rate 15, height 6\' 1"  (1.854 m), weight 205 lb 4 oz (93.1 kg), SpO2 96 %.  Physical Exam: General appearance: alert, cooperative and no distress Heart: regular rate and rhythm Lungs: clear to auscultation bilaterally Abdomen: soft, mild distention, non-tender, + BS Extremities: edema trace Wound: clean and dry  Discharge Condition:Stable and discharged to home  Recent laboratory studies:  Lab Results  Component Value Date   WBC 7.2 04/03/2015   HGB 9.7* 04/03/2015   HCT 29.4* 04/03/2015   MCV 91.0 04/03/2015   PLT 136* 04/03/2015   Lab Results  Component Value Date   NA 138 04/03/2015   K 3.7 04/03/2015   CL 101 04/03/2015   CO2 30 04/03/2015   CREATININE 1.14 04/03/2015   GLUCOSE 112* 04/03/2015    Diagnostic Studies: Dg Chest 2 View  04/03/2015  CLINICAL DATA:  Atelectasis.  Postop CABG EXAM: CHEST  2 VIEW COMPARISON:  Yesterday FINDINGS: Aside from direct EKG leads, support apparatus has been removed. There is no pneumothorax after chest tube removal. Linear atelectasis at the bases is stable. Trace pleural effusions. Anterior pneumomediastinum, expected after chest tube removal. No pulmonary edema. Stable heart size and mediastinal contours after CABG. IMPRESSION: 1. Pneumomediastinum but no pneumothorax after thoracic drain removal. 2. Stable basilar atelectasis and trace effusions. Electronically Signed   By: Monte Fantasia M.D.   On: 04/03/2015 07:20      Medication List    STOP taking these medications        diazepam 5 MG tablet  Commonly known as:   VALIUM     losartan 50 MG tablet  Commonly known as:  COZAAR     nitroGLYCERIN 0.4 MG SL tablet  Commonly known as:  NITROSTAT      TAKE these medications        aspirin 325 MG EC tablet  Take 1 tablet (325 mg total) by mouth daily.     ibuprofen 800 MG tablet  Commonly known as:  ADVIL,MOTRIN  Take 800 mg by mouth as needed for mild pain.     metoprolol tartrate 25 MG tablet  Commonly known as:  LOPRESSOR  Take 0.5 tablets (12.5 mg total) by mouth 2 (two) times daily.     oxyCODONE 5 MG immediate release tablet  Commonly known as:  Oxy IR/ROXICODONE  Take 1-2 tablets (5-10 mg total) by mouth every 4 (four) hours as needed for severe pain.       The patient has been discharged on:   1.Beta Blocker:  Yes [  x ]  No   [   ]                              If No, reason:  2.Ace Inhibitor/ARB: Yes [   ]                                     No  [   x ]                                     If No, reason: Labile blood pressure  3.Statin:   Yes [   ]                  No  [ x  ]                  If No, reason:Allergy  4.Shela CommonsVelta Addison  [ y  ]                  No   [   ]                  If No, reason:  Follow Up Appointments: Follow-up Information    Follow up with [facility] Bradford R, NP On 04/26/2015.   Specialties:  Cardiology, Radiology   Why:  Appointment time is at 9:30 am   Contact information:   [address] [place] [address] [phone]       Follow up with Nurse On 04/11/2015.   Why:  Appointment is to have chest tube sutures remvoed only. Appointment time is at 10:30 am   Contact information:   [address] [place] [address] [phone]      Follow up with Gaye Pollack, MD On 05/03/2015.   Specialty:  Cardiothoracic Surgery   Why:  PA/LAT CXR to be taken (at [facility] which is in the same building as Dr. Vivi Martens office) on 05/03/2015 at 9:45 am;Appointment time is at 10:30 am   Contact  information:   [address] [place] [address] [phone]       Signed: Lars Pinks MPA-C 04/04/2015, 9:29 AM

## 2015-04-04 NOTE — Progress Notes (Signed)
Patient was transferred to [facility] [place], room 28 with monitor on and by ambulation. Report given to the receiving nurse.

## 2015-04-04 NOTE — Progress Notes (Addendum)
      [address]       [place] [address]             [phone]      4 Days Post-Op Procedure(s) (LRB): CORONARY ARTERY BYPASS GRAFTING (CABG) X 4 UTILIZING THE LEFT INTERNAL MAMMARY ARTERY  AND RIGHT SAPHENEOUS VEINS . (N/A) TRANSESOPHAGEAL ECHOCARDIOGRAM (TEE) (N/A)   Subjective:  Kenneth Vance has no complaints this morning.  He is ambulating without difficulty.  No BM   Objective: Vital signs in last 24 hours: Temp:  [98 F (36.7 C)-98.9 F (37.2 C)] 98.6 F (37 C) (12/06 0742) Pulse Rate:  [63-84] 68 (12/06 0700) Cardiac Rhythm:  [-] Normal sinus rhythm (12/05 2000) Resp:  [13-26] 20 (12/06 0700) BP: (92-128)/(56-83) 99/63 mmHg (12/06 0600) SpO2:  [92 %-100 %] 100 % (12/06 0700) Weight:  [205 lb 4 oz (93.1 kg)] 205 lb 4 oz (93.1 kg) (12/06 0600)  Intake/Output from previous day: 12/05 0701 - 12/06 0700 In: 360 [P.O.:360] Out: -   General appearance: alert, cooperative and no distress Heart: regular rate and rhythm Lungs: clear to auscultation bilaterally Abdomen: soft, mild distention, non-tender, + BS Extremities: edema trace Wound: clean and dry  Lab Results:  Recent Labs  04/02/15 0410 04/03/15 0230  WBC 8.6 7.2  HGB 9.9* 9.7*  HCT 30.0* 29.4*  PLT 126* 136*   BMET:  Recent Labs  04/02/15 0410 04/03/15 0230  NA 136 138  K 4.0 3.7  CL 103 101  CO2 28 30  GLUCOSE 113* 112*  BUN 14 14  CREATININE 1.21 1.14  CALCIUM 8.6* 8.6*    PT/INR: No results for input(s): LABPROT, INR in the last 72 hours. ABG    Component Value Date/Time   PHART 7.411 03/31/2015 1724   HCO3 25.4* 03/31/2015 1724   TCO2 25 04/01/2015 1700   O2SAT 99.0 03/31/2015 1724   CBG (last 3)   Recent Labs  04/02/15 0409 04/02/15 0741 04/02/15 1159  GLUCAP 114* 102* 130*    Assessment/Plan: S/P Procedure(s) (LRB): CORONARY ARTERY BYPASS GRAFTING (CABG) X 4 UTILIZING THE LEFT INTERNAL MAMMARY ARTERY  AND RIGHT SAPHENEOUS VEINS .  (N/A) TRANSESOPHAGEAL ECHOCARDIOGRAM (TEE) (N/A)  1. CV- hemodynamically stable- continue Lopressor 2. Pulm- no acute issues, good use of IS 3. Renal- creatinine has been WNL, weight is trending down continue diuresis 4. LOC constipation- will add Lactulose prn 5. Dispo- patient doing well, maintaining NSR will d/c EPW, awaiting bed to transfer to 2W, likely d/c in AM   LOS: 6 days    Kenneth Vance, [name] 04/04/2015   Chart reviewed, patient examined, agree with above. Still waiting on bed on 2W POD4. Plan home tomorrow.

## 2015-04-05 MED ORDER — TRAMADOL HCL 50 MG PO TABS: 50.0000 mg | ORAL_TABLET | ORAL | Status: DC | PRN

## 2015-04-05 MED ORDER — METOPROLOL TARTRATE 25 MG PO TABS: 12.5000 mg | ORAL_TABLET | Freq: Two times a day (BID) | ORAL | Status: DC

## 2015-04-05 MED ORDER — ASPIRIN 325 MG PO TBEC: 325.0000 mg | DELAYED_RELEASE_TABLET | Freq: Every day | ORAL | Status: DC

## 2015-04-05 MED ORDER — OXYCODONE HCL 5 MG PO TABS: 5.0000 mg | ORAL_TABLET | ORAL | Status: DC | PRN

## 2015-04-05 NOTE — Progress Notes (Addendum)
      [address]       [employer] [address]             [phone]      5 Days Post-Op Procedure(s) (LRB): CORONARY ARTERY BYPASS GRAFTING (CABG) X 4 UTILIZING THE LEFT INTERNAL MAMMARY ARTERY  AND RIGHT SAPHENEOUS VEINS . (N/A) TRANSESOPHAGEAL ECHOCARDIOGRAM (TEE) (N/A) Subjective: Looks and feels well  Objective: Vital signs in last 24 hours: Temp:  [98.2 F (36.8 C)-98.9 F (37.2 C)] 98.9 F (37.2 C) (12/07 0518) Pulse Rate:  [64-74] 66 (12/07 0518) Cardiac Rhythm:  [-] Normal sinus rhythm (12/06 2050) Resp:  [15-20] 20 (12/07 0518) BP: (96-131)/(56-73) 96/56 mmHg (12/07 0518) SpO2:  [96 %-100 %] 96 % (12/07 0518) Weight:  [203 lb 4.2 oz (92.2 kg)] 203 lb 4.2 oz (92.2 kg) (12/07 0518)  Hemodynamic parameters for last 24 hours:    Intake/Output from previous day: 12/06 0701 - 12/07 0700 In: 253 [P.O.:240; I.V.:13] Out: -  Intake/Output this shift:    General appearance: alert, cooperative and no distress Heart: regular rate and rhythm Lungs: mildly dim in bases Abdomen: benign Extremities: no edema Wound: incis healing well  Lab Results:  Recent Labs  04/03/15 0230  WBC 7.2  HGB 9.7*  HCT 29.4*  PLT 136*   BMET:  Recent Labs  04/03/15 0230  NA 138  K 3.7  CL 101  CO2 30  GLUCOSE 112*  BUN 14  CREATININE 1.14  CALCIUM 8.6*    PT/INR: No results for input(s): LABPROT, INR in the last 72 hours. ABG    Component Value Date/Time   PHART 7.411 03/31/2015 1724   HCO3 25.4* 03/31/2015 1724   TCO2 25 04/01/2015 1700   O2SAT 99.0 03/31/2015 1724   CBG (last 3)   Recent Labs  04/02/15 1159  GLUCAP 130*    Meds Scheduled Meds: . aspirin EC  325 mg Oral Daily  . bisacodyl  10 mg Oral Daily   Or  . bisacodyl  10 mg Rectal Daily  . docusate sodium  200 mg Oral Daily  . metoprolol tartrate  12.5 mg Oral BID  . pantoprazole  40 mg Oral Daily  . potassium chloride  20 mEq Oral Daily  . sodium chloride  3 mL Intravenous  Q12H  . sodium chloride  3 mL Intravenous Q12H   Continuous Infusions: . sodium chloride     PRN Meds:.sodium chloride, acetaminophen, lactulose, metoprolol, ondansetron (ZOFRAN) IV, oxyCODONE, sodium chloride, sodium chloride, traMADol  Xrays No results found.  Assessment/Plan: S/P Procedure(s) (LRB): CORONARY ARTERY BYPASS GRAFTING (CABG) X 4 UTILIZING THE LEFT INTERNAL MAMMARY ARTERY  AND RIGHT SAPHENEOUS VEINS . (N/A) TRANSESOPHAGEAL ECHOCARDIOGRAM (TEE) (N/A) Plan for discharge: see discharge orders BP runs somewhat low at times so will hold on ace inhib for now Intolerant to statins   LOS: 7 days    GOLD,WAYNE E 04/05/2015   Chart reviewed, patient examined, agree with above. He looks and feels well. Had good BM this am. Plan home today.

## 2015-04-05 NOTE — Progress Notes (Signed)
[id number] Education completed with pt and wife who voiced understanding. Discussed heart healthy diet. Referring to CRP 2 in [place]. Put on discharge video for them to view. Graylon Good RN BSN 04/05/2015 10:54 AM

## 2015-04-05 NOTE — Progress Notes (Signed)
Patient to D/C home with wife. D/C education done. Handouts given on Metoprolol. Patient stats no CP or SOB at this time. IV removed. Tele box removed. Patient wheeled to the front to D/C home with wife.   Domingo Dimes RN

## 2015-04-05 NOTE — Discharge Instructions (Signed)
Activity: 1.May walk up steps                2.No lifting more than ten pounds for four weeks.                 3.No driving for four weeks.                4.Stop any activity that causes chest pain, shortness of breath, dizziness,  sweating or excessive weakness.                5.Avoid straining.                6.Continue with your breathing exercises daily.  Diet: Low fat, Low salt diet  Wound Care: May shower.  Clean wounds with mild soap and water daily. Contact the office at [phone] if any problems arise.  Coronary Artery Bypass Grafting, Care After Refer to this sheet in the next few weeks. These instructions provide you with information on caring for yourself after your procedure. Your health care provider may also give you more specific instructions. Your treatment has been planned according to current medical practices, but problems sometimes occur. Call your health care provider if you have any problems or questions after your procedure. WHAT TO EXPECT AFTER THE PROCEDURE Recovery from surgery will be different for everyone. Some people feel well after 3 or 4 weeks, while for others it takes longer. After your procedure, it is typical to have the following:  Nausea and a lack of appetite.   Constipation.  Weakness and fatigue.   Depression or irritability.   Pain or discomfort at your incision site. HOME CARE INSTRUCTIONS  Take medicines only as directed by your health care provider. Do not stop taking medicines or start any new medicines without first checking with your health care provider.  Take your pulse as directed by your health care provider.  Perform deep breathing as directed by your health care provider. If you were given a device called an incentive spirometer, use it to practice deep breathing several times a day. Support your chest with a pillow or your arms when you take deep breaths or cough.  Keep incision areas clean, dry, and protected. Remove or  change any bandages (dressings) only as directed by your health care provider. You may have skin adhesive strips over the incision areas. Do not take the strips off. They will fall off on their own.  Check incision areas daily for any swelling, redness, or drainage.  If incisions were made in your legs, do the following:  Avoid crossing your legs.   Avoid sitting for long periods of time. Change positions every 30 minutes.   Elevate your legs when you are sitting.  Wear compression stockings as directed by your health care provider. These stockings help keep blood clots from forming in your legs.  Take showers once your health care provider approves. Until then, only take sponge baths. Pat incisions dry. Do not rub incisions with a washcloth or towel. Do not take baths, swim, or use a hot tub until your health care provider approves.  Eat foods that are high in fiber, such as raw fruits and vegetables, whole grains, beans, and nuts. Meats should be lean cut. Avoid canned, processed, and fried foods.  Drink enough fluid to keep your urine clear or pale yellow.  Weigh yourself every day. This helps identify if you are retaining fluid that may make your heart and lungs work harder.  Rest and limit activity as directed by your health care provider. You may be instructed to:  Stop any activity at once if you have chest pain, shortness of breath, irregular heartbeats, or dizziness. Get help right away if you have any of these symptoms.  Move around frequently for short periods or take short walks as directed by your health care provider. Increase your activities gradually. You may need physical therapy or cardiac rehabilitation to help strengthen your muscles and build your endurance.  Avoid lifting, pushing, or pulling anything heavier than 10 lb (4.5 kg) for at least 6 weeks after surgery.  Do not drive until your health care provider approves.  Ask your health care provider when you  may return to work.  Ask your health care provider when you may resume sexual activity.  Keep all follow-up visits as directed by your health care provider. This is important. SEEK MEDICAL CARE IF:  You have swelling, redness, increasing pain, or drainage at the site of an incision.  You have a fever.  You have swelling in your ankles or legs.  You have pain in your legs.   You gain 2 or more pounds (0.9 kg) a day.  You are nauseous or vomit.  You have diarrhea. SEEK IMMEDIATE MEDICAL CARE IF:  You have chest pain that goes to your jaw or arms.  You have shortness of breath.   You have a fast or irregular heartbeat.   You notice a "clicking" in your breastbone (sternum) when you move.   You have numbness or weakness in your arms or legs.  You feel dizzy or light-headed.  MAKE SURE YOU:  Understand these instructions.  Will watch your condition.  Will get help right away if you are not doing well or get worse.   This information is not intended to replace advice given to you by your health care provider. Make sure you discuss any questions you have with your health care provider.   Document Released: 11/02/2004 Document Revised: 05/06/2014 Document Reviewed: 09/22/2012 Elsevier Interactive Patient Education 2016 [place].   Endoscopic Saphenous Vein Harvesting, Care After Refer to this sheet in the next few weeks. These instructions provide you with information on caring for yourself after your procedure. Your health care provider may also give you more specific instructions. Your treatment has been planned according to current medical practices, but problems sometimes occur. Call your health care provider if you have any problems or questions after your procedure. HOME CARE INSTRUCTIONS Medicine Take whatever pain medicine your surgeon prescribes. Follow the directions carefully. Do not take over-the-counter pain medicine unless your surgeon says it is  okay. Some pain medicine can cause bleeding problems for several weeks after surgery. Follow your surgeon's instructions about driving. You will probably not be permitted to drive after heart surgery. Take any medicines your surgeon prescribes. Any medicines you took before your heart surgery should be checked with your health care provider before you start taking them again. Wound care If your surgeon has prescribed an elastic bandage or stocking, ask how long you should wear it. Check the area around your surgical cuts (incisions) whenever your bandages (dressings) are changed. Look for any redness or swelling. You will need to return to have the stitches (sutures) or staples taken out. Ask your surgeon when to do that. Ask your surgeon when you can shower or bathe. Activity Try to keep your legs raised when you are sitting. Do any exercises your health care providers have  given you. These may include deep breathing exercises, coughing, walking, or other exercises. SEEK MEDICAL CARE IF: You have any questions about your medicines. You have more leg pain, especially if your pain medicine stops working. New or growing bruises develop on your leg. Your leg swells, feels tight, or becomes red. You have numbness in your leg. SEEK IMMEDIATE MEDICAL CARE IF: Your pain gets much worse. Blood or fluid leaks from any of the incisions. Your incisions become warm, swollen, or red. You have chest pain. You have trouble breathing. You have a fever. You have more pain near your leg incision. MAKE SURE YOU: Understand these instructions. Will watch your condition. Will get help right away if you are not doing well or get worse.   This information is not intended to replace advice given to you by your health care provider. Make sure you discuss any questions you have with your health care provider.   Document Released: 12/26/2010 Document Revised: 05/06/2014 Document Reviewed: 12/26/2010 Elsevier  Interactive Patient Education [employer].

## 2015-04-11 ENCOUNTER — Telehealth: Payer: Self-pay | Admitting: Cardiovascular Disease

## 2015-04-11 ENCOUNTER — Ambulatory Visit (INDEPENDENT_AMBULATORY_CARE_PROVIDER_SITE_OTHER): Payer: Self-pay

## 2015-04-11 DIAGNOSIS — I2510 Atherosclerotic heart disease of native coronary artery without angina pectoris: Secondary | ICD-10-CM

## 2015-04-11 DIAGNOSIS — Z951 Presence of aortocoronary bypass graft: Secondary | ICD-10-CM

## 2015-04-11 DIAGNOSIS — Z4802 Encounter for removal of sutures: Secondary | ICD-10-CM

## 2015-04-11 NOTE — Telephone Encounter (Signed)
°  Follow Up   Pt had 3 page disability form faxed over. Calling to verify it was received. Please call.

## 2015-04-11 NOTE — Progress Notes (Signed)
Removed 3 sutures from chest tube incision sites with no signs of infection and patient tolerated well.  

## 2015-04-12 NOTE — Telephone Encounter (Signed)
Spoke with patient and advised him that I have not received paperwork yet.  I advised that when disability forms are received, they are processed by HIM and then given to the doctor for completion.  I advised him that I will call him back early next week if I have not received the forms.  He verbalized understanding and agreement.

## 2015-04-18 NOTE — Telephone Encounter (Signed)
Called patient and asked him to resend forms to [phone].  He verbalized understanding and agreement.

## 2015-04-18 NOTE — Telephone Encounter (Signed)
Received paperwork.  Spoke with patient about his job responsibilities and advised him I will call him back with further questions.  Patient verbalized understanding and agreement.

## 2015-04-18 NOTE — Telephone Encounter (Signed)
Dr. Acie Fredrickson advised it would be better for Dr. Cyndia Bent to clear patient to return to work following surgery. I called Dr. Vivi Martens office and spoke with Manuela Schwartz who advised I can fax paperwork to [phone].  I left a message on patient's voice mail regarding this change and advised him to call back with questions or concerns.

## 2015-04-26 ENCOUNTER — Encounter: Payer: BLUE CROSS/BLUE SHIELD | Admitting: Cardiology

## 2015-04-26 ENCOUNTER — Encounter: Payer: Self-pay | Admitting: Cardiology

## 2015-04-26 NOTE — Progress Notes (Signed)
This encounter was created in error - please disregard.

## 2015-05-02 ENCOUNTER — Other Ambulatory Visit: Payer: Self-pay | Admitting: Surgery

## 2015-05-02 DIAGNOSIS — Z951 Presence of aortocoronary bypass graft: Secondary | ICD-10-CM

## 2015-05-03 ENCOUNTER — Encounter: Payer: Self-pay | Admitting: Surgery

## 2015-05-03 ENCOUNTER — Ambulatory Visit (INDEPENDENT_AMBULATORY_CARE_PROVIDER_SITE_OTHER): Payer: Self-pay | Admitting: Surgery

## 2015-05-03 ENCOUNTER — Other Ambulatory Visit: Payer: Self-pay | Admitting: *Deleted

## 2015-05-03 ENCOUNTER — Ambulatory Visit
Admission: RE | Admit: 2015-05-03 | Discharge: 2015-05-03 | Disposition: A | Discharge status: Home / Self Care | Payer: BLUE CROSS/BLUE SHIELD | Source: Ambulatory Visit | Attending: Surgery | Admitting: Surgery

## 2015-05-03 VITALS — BP 114/81 | HR 63 | Resp 16 | Ht 73.0 in | Wt 195.0 lb

## 2015-05-03 DIAGNOSIS — Z951 Presence of aortocoronary bypass graft: Secondary | ICD-10-CM

## 2015-05-03 DIAGNOSIS — G8918 Other acute postprocedural pain: Secondary | ICD-10-CM

## 2015-05-03 MED ORDER — TRAMADOL HCL 50 MG PO TABS: 50.0000 mg | ORAL_TABLET | ORAL | Status: DC | PRN

## 2015-05-03 NOTE — Progress Notes (Signed)
      HPI: Patient returns for routine postoperative follow-up having undergone CABG x 4 on 03/31/2015. The patient's early postoperative recovery while in the hospital was notable for an uncomplicated postop course. Since hospital discharge the patient reports that he is walking 25-30 miles per week without chest pain or shortness of breath.   Current Outpatient Prescriptions  Medication Sig Dispense Refill  . aspirin EC 325 MG EC tablet Take 1 tablet (325 mg total) by mouth daily. 30 tablet 0  . ibuprofen (ADVIL,MOTRIN) 800 MG tablet Take 800 mg by mouth as needed for mild pain.   0  . metoprolol tartrate (LOPRESSOR) 25 MG tablet Take 0.5 tablets (12.5 mg total) by mouth 2 (two) times daily. 31 tablet 1  . traMADol (ULTRAM) 50 MG tablet Take 1-2 tablets (50-100 mg total) by mouth every 4 (four) hours as needed for moderate pain. 30 tablet 0   No current facility-administered medications for this visit.    Physical Exam: BP 114/81 mmHg  Pulse 63  Resp 16  Ht 6\' 1"  (1.854 m)  Wt 195 lb (88.451 kg)  BMI 25.73 kg/m2  SpO2 98% He looks well. Lung exam is clear. Cardiac exam shows a regular rate and rhythm with normal heart sounds. Chest incision is healing well and sternum is stable. The leg incisions are healing well and there is no peripheral edema.   Diagnostic Tests:  CLINICAL DATA: Post CABG x 4 on 03/31/2015, hypertension  EXAM: CHEST 2 VIEW  COMPARISON: 04/03/2015  FINDINGS: Normal heart size post CABG.  Mediastinal contours and pulmonary vascularity normal.  Lungs clear.  No pleural effusion or pneumothorax.  Minimal scattered endplate spur formation thoracic spine.  IMPRESSION: Postsurgical changes of CABG.  No acute abnormalities.  When compared to the previous exam, interval resolution of previously identified bibasilar atelectasis, pleural effusions, and pneumomediastinum.   Electronically Signed  By: Lavonia Dana M.D.  On:  05/03/2015 10:24   Impression:  Overall I think he is doing well. He is highly motivated to continue an active lifestyle. He is not planning to participate in cardiac rehab which I think is fine since he is so active. I told him he could drive his car but should not lift anything heavier than 10 lbs for three months postop. He has had difficult to control hyperlipidemia and has had significant myalgias and joint pains with every statin drug that has been tried. He is going to discuss further treatment with Dr. Acie Fredrickson.   Plan:  He is going to follow up with Dr. Marisue Humble next week and Dr. Acie Fredrickson in February.   Gaye Pollack, MD [facility] [phone]

## 2015-05-04 ENCOUNTER — Telehealth (HOSPITAL_COMMUNITY): Payer: Self-pay | Admitting: *Deleted

## 2015-05-04 NOTE — Telephone Encounter (Signed)
Pt seen in follow up with Dr Cyndia Bent. Pt indicated he was not interested in cardiac rehab. Pt confirmed that he is active and will continue to exercise on his own. Cherre Huger, BSN

## 2015-05-16 ENCOUNTER — Encounter: Payer: Self-pay | Admitting: Cardiovascular Disease

## 2015-05-18 ENCOUNTER — Telehealth: Payer: Self-pay | Admitting: Nurse Practitioner

## 2015-05-18 MED ORDER — ROSUVASTATIN CALCIUM 10 MG PO TABS: 10.0000 mg | ORAL_TABLET | ORAL | Status: DC

## 2015-05-18 NOTE — Telephone Encounter (Signed)
Reviewed lab results and plan of care with patient who verbalized understanding and agreement to try Crestor 10 mg every other day or 3 times per week.  Patient scheduled for follow-up with Dr. Acie Fredrickson on 2/1.

## 2015-05-18 NOTE — Telephone Encounter (Signed)
-----   Message from Thayer Headings, MD sent at 05/16/2015  5:35 PM EST ----- He has been generally intolerant to statins. I do not see that he has tried Crestor yet.   Lets try 10 mg every other day  If he does not tolerate this,  Have him try Crestor 10 mg once a week. Will anticipate adding Zetia at his next office visit on several weeks.

## 2015-05-31 ENCOUNTER — Encounter: Payer: Self-pay | Admitting: Cardiovascular Disease

## 2015-05-31 ENCOUNTER — Telehealth: Payer: Self-pay | Admitting: Pharmacist

## 2015-05-31 ENCOUNTER — Ambulatory Visit (INDEPENDENT_AMBULATORY_CARE_PROVIDER_SITE_OTHER): Payer: BLUE CROSS/BLUE SHIELD | Admitting: Cardiovascular Disease

## 2015-05-31 VITALS — BP 118/62 | HR 62 | Ht 73.0 in | Wt 200.0 lb

## 2015-05-31 DIAGNOSIS — I10 Essential (primary) hypertension: Secondary | ICD-10-CM | POA: Diagnosis not present

## 2015-05-31 DIAGNOSIS — E785 Hyperlipidemia, unspecified: Secondary | ICD-10-CM | POA: Diagnosis not present

## 2015-05-31 DIAGNOSIS — I209 Angina pectoris, unspecified: Secondary | ICD-10-CM

## 2015-05-31 DIAGNOSIS — I25119 Atherosclerotic heart disease of native coronary artery with unspecified angina pectoris: Secondary | ICD-10-CM

## 2015-05-31 NOTE — Progress Notes (Signed)
Cardiology Office Note   Date:  05/31/2015   ID:  Kenneth Vance, DOB Mar 11, 1956, MRN [id number]  PCP:  Kenneth Huh, MD  Cardiologist:   Kenneth Headings, MD   Chief Complaint  Patient presents with  . Coronary Artery Disease   Problem list 1. Coronary artery disease-status post coronary artery bypass grafting-December 2 , 2016  2. Hyperlipidemia   History of Present Illness: Kenneth Vance is a 60 y.o. male who presents for  CAD / CABG Doing much better after CABG Has lost 10 lbs. Walks 3-5 miles a day .   No angina   Has been on a very low fat diet. Has tried numerous statins and did not tolerate it at all. Have prescribed crestor  - has not started it yet.    Has tolerated fenofobrate fairly well.    Past Medical History  Diagnosis Date  . BCC (basal cell carcinoma), ear   . Hyperlipidemia   . CAD in native artery 03/29/15    severe CAD   . S/P CABG x 4 03/31/15    Past Surgical History  Procedure Laterality Date  . Mandible surgery    . Hydrocele excision    . Cataract extraction Left   . Cardiac catheterization N/A 03/29/2015    Procedure: Left Heart Cath and Coronary Angiography;  Surgeon: Jettie Booze, MD;  Location: [facility];  Service: Cardiovascular;  Laterality: N/A;  . Coronary artery bypass graft N/A 03/31/2015    Procedure: CORONARY ARTERY BYPASS GRAFTING (CABG) X 4 UTILIZING THE LEFT INTERNAL MAMMARY ARTERY  AND RIGHT SAPHENEOUS VEINS .;  Surgeon: Gaye Pollack, MD;  Location: [facility];  Service: Open Heart Surgery;  Laterality: N/A;  . Tee without cardioversion N/A 03/31/2015    Procedure: TRANSESOPHAGEAL ECHOCARDIOGRAM (TEE);  Surgeon: Gaye Pollack, MD;  Location: [facility];  Service: Open Heart Surgery;  Laterality: N/A;     Current Outpatient Prescriptions  Medication Sig Dispense Refill  . aspirin EC 325 MG EC tablet Take 1 tablet (325 mg total) by mouth daily. 30 tablet 0  . ibuprofen (ADVIL,MOTRIN) 800 MG tablet Take 800 mg by  mouth as needed for mild pain.   0  . metoprolol tartrate (LOPRESSOR) 25 MG tablet Take 0.5 tablets (12.5 mg total) by mouth 2 (two) times daily. 31 tablet 1  . rosuvastatin (CRESTOR) 10 MG tablet Take 1 tablet (10 mg total) by mouth every other day. 15 tablet 6  . traMADol (ULTRAM) 50 MG tablet Take 1-2 tablets (50-100 mg total) by mouth every 4 (four) hours as needed for moderate pain. 40 tablet 0   No current facility-administered medications for this visit.    Allergies:   Peanut-containing drug products; Fenofibrate; Lipitor; Livalo; Niacin; Niacin and related; Peanut oil; and Zocor    Social History:  The patient  reports that he has never smoked. He does not have any smokeless tobacco history on file. He reports that he drinks alcohol. He reports that he does not use illicit drugs.   Family History:  The patient's family history includes Hyperlipidemia in his mother; Pulmonary fibrosis in his father.    ROS:  Please see the history of present illness.    Review of Systems: Constitutional:  denies fever, chills, diaphoresis, appetite change and fatigue.  HEENT: denies photophobia, eye pain, redness, hearing loss, ear pain, congestion, sore throat, rhinorrhea, sneezing, neck pain, neck stiffness and tinnitus.  Respiratory: denies SOB, DOE, cough, chest tightness, and  wheezing.  Cardiovascular: denies chest pain, palpitations and leg swelling.  Gastrointestinal: denies nausea, vomiting, abdominal pain, diarrhea, constipation, blood in stool.  Genitourinary: denies dysuria, urgency, frequency, hematuria, flank pain and difficulty urinating.  Musculoskeletal: denies  myalgias, back pain, joint swelling, arthralgias and gait problem.   Skin: denies pallor, rash and wound.  Neurological: denies dizziness, seizures, syncope, weakness, light-headedness, numbness and headaches.   Hematological: denies adenopathy, easy bruising, personal or family bleeding history.   Psychiatric/ Behavioral: denies suicidal ideation, mood changes, confusion, nervousness, sleep disturbance and agitation.       All other systems are reviewed and negative.    PHYSICAL EXAM: VS:  BP 118/62 mmHg  Pulse 62  Ht 6\' 1"  (1.854 m)  Wt 200 lb (90.719 kg)  BMI 26.39 kg/m2 , BMI Body mass index is 26.39 kg/(m^2). GEN: Well nourished, well developed, in no acute distress HEENT: normal Neck: no JVD, carotid bruits, or masses Cardiac: RRR; no murmurs, rubs, or gallops,no edema ,  Respiratory:  clear to auscultation bilaterally, normal work of breathing GI: soft, nontender, nondistended, + BS MS: no deformity or atrophy Skin: warm and dry, no rash Neuro:  Strength and sensation are intact Psych: normal   EKG:  EKG is not ordered today. The ekg ordered today demonstrates    Recent Labs: 03/28/2015: ALT 15 04/01/2015: Magnesium 2.0 04/03/2015: BUN 14; Creatinine, Ser 1.14; Hemoglobin 9.7*; Platelets 136*; Potassium 3.7; Sodium 138    Lipid Panel    Component Value Date/Time   CHOL 258* 03/28/2015 1009   TRIG 195* 03/28/2015 1009   HDL 46 03/28/2015 1009   CHOLHDL 5.6* 03/28/2015 1009   VLDL 39* 03/28/2015 1009   LDLCALC 173* 03/28/2015 1009      Wt Readings from Last 3 Encounters:  05/31/15 200 lb (90.719 kg)  05/03/15 195 lb (88.451 kg)  04/05/15 203 lb 4.2 oz (92.2 kg)      Other studies Reviewed: Additional studies/ records that were reviewed today include: . Review of the above records demonstrates:    ASSESSMENT AND PLAN:  1.  Coronary artery disease:  Skip is doing very well following his coronary artery bypass grafting. He's having minimal chest soreness. He's not having any episodes of angina. Continue current medications.  Will decrease his ASA to 81 mg a day .   2. Hyperlipidemia: He has very high lipids. He's a very low fat diet and exercises quite regular. He does not tolerate Zocor or Lipitor. We'll try him on Crestor. Had long discussion  about his lipid management. We will refer him to lipid clinic. I'll see him in 3 months for follow-up office visit.   Current medicines are reviewed at length with the patient today.  The patient does not have concerns regarding medicines.  The following changes have been made:  no change  Labs/ tests ordered today include:  No orders of the defined types were placed in this encounter.     Disposition:   FU with me in 3 months      Marquis Down, Wonda Cheng, MD  05/31/2015 8:26 AM    [facility] [place], [place], [place]  [address] Phone: [phone]; Fax: [phone]   [facility]  [address] [place],   [address] [phone]   Fax [phone]

## 2015-05-31 NOTE — Telephone Encounter (Signed)
Called pt regarding lipid clinic visit for tomorrow - since pt is starting low dose Crestor TIW today, do not need to see him quite yet in clinic. Advised pt to start his Crestor and to call us in lipid clinic if he has issues tolerating the Crestor. If this is the case, would schedule pt for lipid clinic appt to likely pursue Praluent.

## 2015-05-31 NOTE — Patient Instructions (Signed)
Medication Instructions:  Your physician recommends that you continue on your current medications as directed. Please refer to the Current Medication list given to you today.   Labwork: None Ordered   Testing/Procedures: None Ordered   Follow-Up: Your physician recommends that you schedule an appointment in: Lipid clinic for soon  Your physician recommends that you schedule a follow-up appointment in: 3 months with Dr. Acie Fredrickson    If you need a refill on your cardiac medications before your next appointment, please call your pharmacy.   Thank you for choosing [facility]! Christen Bame, RN [phone]

## 2015-06-01 ENCOUNTER — Ambulatory Visit: Payer: BLUE CROSS/BLUE SHIELD | Admitting: Pharmacist

## 2015-06-02 ENCOUNTER — Telehealth: Payer: Self-pay

## 2015-06-02 ENCOUNTER — Other Ambulatory Visit: Payer: Self-pay

## 2015-06-02 MED ORDER — ASPIRIN 81 MG PO TBEC
81.0000 mg | DELAYED_RELEASE_TABLET | Freq: Every day | ORAL | Status: DC
Start: 2015-06-02 — End: 2022-05-17

## 2015-06-02 NOTE — Telephone Encounter (Signed)
**Note De-Identified [place] Obfuscation** Left a detailed message on the pts VM stating that he will need to refer to his surgeon for his return to work letter as his surgeon is the MD who took the pt out of work. I left this offices phone number to call back if he has any questions.

## 2015-06-02 NOTE — Telephone Encounter (Signed)
The pt is asking for a return to work note. Is this ok. Please advise.

## 2015-08-24 ENCOUNTER — Emergency Department (HOSPITAL_COMMUNITY): Payer: BLUE CROSS/BLUE SHIELD

## 2015-08-24 ENCOUNTER — Encounter (HOSPITAL_COMMUNITY): Payer: Self-pay | Admitting: Emergency Medicine

## 2015-08-24 ENCOUNTER — Emergency Department (HOSPITAL_COMMUNITY)
Admission: EM | Admit: 2015-08-24 | Discharge: 2015-08-24 | Disposition: A | Discharge status: Home / Self Care | Payer: BLUE CROSS/BLUE SHIELD | Attending: Emergency Medicine | Admitting: Emergency Medicine

## 2015-08-24 DIAGNOSIS — Z9889 Other specified postprocedural states: Secondary | ICD-10-CM | POA: Insufficient documentation

## 2015-08-24 DIAGNOSIS — Z951 Presence of aortocoronary bypass graft: Secondary | ICD-10-CM | POA: Insufficient documentation

## 2015-08-24 DIAGNOSIS — Z85828 Personal history of other malignant neoplasm of skin: Secondary | ICD-10-CM | POA: Insufficient documentation

## 2015-08-24 DIAGNOSIS — E785 Hyperlipidemia, unspecified: Secondary | ICD-10-CM | POA: Diagnosis not present

## 2015-08-24 DIAGNOSIS — N200 Calculus of kidney: Secondary | ICD-10-CM

## 2015-08-24 DIAGNOSIS — Z79899 Other long term (current) drug therapy: Secondary | ICD-10-CM | POA: Insufficient documentation

## 2015-08-24 DIAGNOSIS — Z7982 Long term (current) use of aspirin: Secondary | ICD-10-CM | POA: Insufficient documentation

## 2015-08-24 DIAGNOSIS — I2510 Atherosclerotic heart disease of native coronary artery without angina pectoris: Secondary | ICD-10-CM | POA: Insufficient documentation

## 2015-08-24 DIAGNOSIS — R109 Unspecified abdominal pain: Secondary | ICD-10-CM | POA: Diagnosis present

## 2015-08-24 LAB — COMPREHENSIVE METABOLIC PANEL
ALBUMIN: 4.2 g/dL (ref 3.5–5.0)
ALK PHOS: 62 U/L (ref 38–126)
ALT: 15 U/L — AB (ref 17–63)
ANION GAP: 12 (ref 5–15)
AST: 20 U/L (ref 15–41)
BILIRUBIN TOTAL: 1 mg/dL (ref 0.3–1.2)
BUN: 19 mg/dL (ref 6–20)
CALCIUM: 9.7 mg/dL (ref 8.9–10.3)
CO2: 25 mmol/L (ref 22–32)
CREATININE: 1.29 mg/dL — AB (ref 0.61–1.24)
Chloride: 105 mmol/L (ref 101–111)
GFR calc non Af Amer: 60 mL/min — ABNORMAL LOW (ref >60)
GLUCOSE: 112 mg/dL — AB (ref 65–99)
Potassium: 4.1 mmol/L (ref 3.5–5.1)
Sodium: 142 mmol/L (ref 135–145)
TOTAL PROTEIN: 6.7 g/dL (ref 6.5–8.1)

## 2015-08-24 LAB — URINE MICROSCOPIC-ADD ON

## 2015-08-24 LAB — URINALYSIS, ROUTINE W REFLEX MICROSCOPIC
BILIRUBIN URINE: NEGATIVE
Glucose, UA: NEGATIVE mg/dL
KETONES UR: NEGATIVE mg/dL
Leukocytes, UA: NEGATIVE
NITRITE: NEGATIVE
PROTEIN: NEGATIVE mg/dL
SPECIFIC GRAVITY, URINE: 1.019 (ref 1.005–1.030)
pH: 8 (ref 5.0–8.0)

## 2015-08-24 LAB — CBC
HCT: 45.9 % (ref 39.0–52.0)
HEMOGLOBIN: 15.6 g/dL (ref 13.0–17.0)
MCH: 29.3 pg (ref 26.0–34.0)
MCHC: 34 g/dL (ref 30.0–36.0)
MCV: 86.1 fL (ref 78.0–100.0)
PLATELETS: 175 10*3/uL (ref 150–400)
RBC: 5.33 MIL/uL (ref 4.22–5.81)
RDW: 13.9 % (ref 11.5–15.5)
WBC: 6.8 10*3/uL (ref 4.0–10.5)

## 2015-08-24 LAB — LIPASE, BLOOD: Lipase: 25 U/L (ref 11–51)

## 2015-08-24 MED ORDER — IBUPROFEN 400 MG PO TABS: 400.0000 mg | ORAL_TABLET | Freq: Three times a day (TID) | ORAL | Status: DC

## 2015-08-24 MED ORDER — TAMSULOSIN HCL 0.4 MG PO CAPS: 0.4000 mg | ORAL_CAPSULE | Freq: Once | ORAL | Status: DC

## 2015-08-24 MED ORDER — TAMSULOSIN HCL 0.4 MG PO CAPS
0.4000 mg | ORAL_CAPSULE | Freq: Once | ORAL | Status: AC
  Administered 2015-08-24: 0.4 mg via ORAL
  Filled 2015-08-24: qty 1

## 2015-08-24 MED ORDER — OXYCODONE-ACETAMINOPHEN 5-325 MG PO TABS
1.0000 | ORAL_TABLET | Freq: Four times a day (QID) | ORAL | Status: DC | PRN
Start: 2015-08-24 — End: 2015-08-30

## 2015-08-24 MED ORDER — OXYCODONE-ACETAMINOPHEN 5-325 MG PO TABS
2.0000 | ORAL_TABLET | Freq: Once | ORAL | Status: AC
  Administered 2015-08-24: 2 via ORAL
  Filled 2015-08-24: qty 2

## 2015-08-24 MED ORDER — MORPHINE SULFATE (PF) 4 MG/ML IV SOLN
6.0000 mg | Freq: Once | INTRAVENOUS | Status: AC
  Administered 2015-08-24: 6 mg via INTRAVENOUS
  Filled 2015-08-24: qty 2

## 2015-08-24 MED ORDER — KETOROLAC TROMETHAMINE 30 MG/ML IJ SOLN
30.0000 mg | Freq: Once | INTRAMUSCULAR | Status: AC
  Administered 2015-08-24: 30 mg via INTRAVENOUS
  Filled 2015-08-24: qty 1

## 2015-08-24 MED ORDER — ONDANSETRON HCL 4 MG/2ML IJ SOLN
4.0000 mg | Freq: Once | INTRAMUSCULAR | Status: AC | PRN
  Administered 2015-08-24: 4 mg via INTRAVENOUS
  Filled 2015-08-24: qty 2

## 2015-08-24 MED ORDER — HYDROMORPHONE HCL 1 MG/ML IJ SOLN
1.0000 mg | INTRAMUSCULAR | Status: DC | PRN
  Administered 2015-08-24: 1 mg via INTRAVENOUS
  Filled 2015-08-24: qty 1

## 2015-08-24 MED ORDER — ONDANSETRON 4 MG PO TBDP: ORAL_TABLET | ORAL | Status: DC

## 2015-08-24 NOTE — ED Notes (Signed)
To ED via [facility] with c/o sudden onset right lower abd pain radiating into groin -- no hx kidney stones, writhing in pain.

## 2015-08-24 NOTE — ED Provider Notes (Signed)
CSN: [id number]     Arrival date & time 08/24/15  0757 History   First MD Initiated Contact with Patient 08/24/15 0801     Chief Complaint  Patient presents with  . Abdominal Pain     (Consider location/radiation/quality/duration/timing/severity/associated sxs/prior Treatment) Patient is a 60 y.o. male presenting with flank pain.  Flank Pain This is a new problem. The current episode started 1 to 2 hours ago. The problem occurs constantly. The problem has not changed since onset.Associated symptoms include abdominal pain. Pertinent negatives include no chest pain, no headaches and no shortness of breath. Nothing aggravates the symptoms. Nothing relieves the symptoms. He has tried nothing for the symptoms.    Past Medical History  Diagnosis Date  . BCC (basal cell carcinoma), ear   . Hyperlipidemia   . CAD in native artery 03/29/15    severe CAD   . S/P CABG x 4 03/31/15   Past Surgical History  Procedure Laterality Date  . Mandible surgery    . Hydrocele excision    . Cataract extraction Left   . Cardiac catheterization N/A 03/29/2015    Procedure: Left Heart Cath and Coronary Angiography;  Surgeon: Jettie Booze, MD;  Location: [facility];  Service: Cardiovascular;  Laterality: N/A;  . Coronary artery bypass graft N/A 03/31/2015    Procedure: CORONARY ARTERY BYPASS GRAFTING (CABG) X 4 UTILIZING THE LEFT INTERNAL MAMMARY ARTERY  AND RIGHT SAPHENEOUS VEINS .;  Surgeon: Gaye Pollack, MD;  Location: [place] [facility];  Service: Open Heart Surgery;  Laterality: N/A;  . Tee without cardioversion N/A 03/31/2015    Procedure: TRANSESOPHAGEAL ECHOCARDIOGRAM (TEE);  Surgeon: Gaye Pollack, MD;  Location: [facility];  Service: Open Heart Surgery;  Laterality: N/A;   Family History  Problem Relation Age of Onset  . Hyperlipidemia Mother   . Pulmonary fibrosis Father    Social History  Substance Use Topics  . Smoking status: Never Smoker   . Smokeless tobacco: None  . Alcohol Use: 0.0  oz/week    0 Standard drinks or equivalent per week    Review of Systems  Constitutional: Negative for fever, chills and activity change.  HENT: Negative for congestion and rhinorrhea.   Eyes: Negative for visual disturbance.  Respiratory: Negative for cough and shortness of breath.   Cardiovascular: Negative for chest pain.  Gastrointestinal: Positive for nausea, vomiting and abdominal pain. Negative for diarrhea and constipation.  Endocrine: Negative for polyuria.  Genitourinary: Negative for dysuria and flank pain.  Musculoskeletal: Positive for back pain (flank). Negative for neck pain.  Skin: Negative for wound.  Neurological: Negative for headaches.  All other systems reviewed and are negative.     Allergies  Peanut-containing drug products; Fenofibrate; Lipitor; Livalo; Niacin; Niacin and related; Peanut oil; and Zocor  Home Medications   Prior to Admission medications   Medication Sig Start Date End Date Taking? Authorizing Provider  aspirin 81 MG EC tablet Take 1 tablet (81 mg total) by mouth daily. 06/02/15  Yes Thayer Headings, MD  ibuprofen (ADVIL,MOTRIN) 400 MG tablet Take 1 tablet (400 mg total) by mouth 3 (three) times daily. 08/24/15   Merrily Pew, MD  ondansetron (ZOFRAN ODT) 4 MG disintegrating tablet 4mg  ODT q4 hours prn nausea/vomit 08/24/15   Merrily Pew, MD  oxyCODONE-acetaminophen (PERCOCET/ROXICET) 5-325 MG tablet Take 1-2 tablets by mouth every 6 (six) hours as needed for severe pain. 08/24/15   Merrily Pew, MD  rosuvastatin (CRESTOR) 10 MG tablet Take 1 tablet (10  mg total) by mouth every other day. 05/18/15   Thayer Headings, MD  tamsulosin (FLOMAX) 0.4 MG CAPS capsule Take 1 capsule (0.4 mg total) by mouth once. 08/24/15   Merrily Pew, MD  traMADol (ULTRAM) 50 MG tablet Take 1-2 tablets (50-100 mg total) by mouth every 4 (four) hours as needed for moderate pain. 05/03/15   Gaye Pollack, MD   BP 142/85 mmHg  Pulse 47  Temp(Src) 97.8 F (36.6 C) (Oral)   Resp 16  SpO2 97% Physical Exam  Constitutional: He is oriented to person, place, and time. He appears well-developed and well-nourished. He appears distressed (uncomfortable/writhing in bed).  HENT:  Head: Normocephalic and atraumatic.  Neck: Normal range of motion.  Cardiovascular: Normal rate.   Pulmonary/Chest: Effort normal. No respiratory distress.  Abdominal: Soft. He exhibits no distension.  Musculoskeletal: Normal range of motion. He exhibits no edema or tenderness.  Neurological: He is alert and oriented to person, place, and time.  Skin: Skin is warm and dry. No rash noted. No erythema.  Nursing note and vitals reviewed.   ED Course  Procedures (including critical care time) Labs Review Labs Reviewed  COMPREHENSIVE METABOLIC PANEL - Abnormal; Notable for the following:    Glucose, Bld 112 (*)    Creatinine, Ser 1.29 (*)    ALT 15 (*)    GFR calc non Af Amer 60 (*)    All other components within normal limits  URINALYSIS, ROUTINE W REFLEX MICROSCOPIC (NOT AT [facility]) - Abnormal; Notable for the following:    Hgb urine dipstick SMALL (*)    All other components within normal limits  URINE MICROSCOPIC-ADD ON - Abnormal; Notable for the following:    Squamous Epithelial / LPF 0-5 (*)    Bacteria, UA RARE (*)    All other components within normal limits  LIPASE, BLOOD  CBC    Imaging Review Ct Renal Stone Study  08/24/2015  CLINICAL DATA:  Acute right flank pain. EXAM: CT ABDOMEN AND PELVIS WITHOUT CONTRAST TECHNIQUE: Multidetector CT imaging of the abdomen and pelvis was performed following the standard protocol without IV contrast. COMPARISON:  None. FINDINGS: Visualized lung bases are unremarkable. Moderate bilateral degenerative joint disease of both hips is noted. No gallstones are noted. No focal abnormality is noted in the liver, spleen or pancreas on these unenhanced images. Adrenal glands appear normal. Bilateral nephrolithiasis is noted. Mild right  hydroureteronephrosis is noted secondary to 3 mm calculus at right ureterovesical junction. The appendix appears normal. There is no evidence of bowel obstruction. No abnormal fluid collection is noted. Atherosclerosis of abdominal aorta is noted without aneurysm formation. No significant adenopathy is noted. Urinary bladder and prostate gland are unremarkable. IMPRESSION: Bilateral nephrolithiasis. Mild right hydroureteronephrosis secondary to 3 mm calculus at right ureterovesical junction. Electronically Signed   By: Marijo Conception, M.D.   On: 08/24/2015 10:03   I have personally reviewed and evaluated these images and lab results as part of my medical decision-making.   EKG Interpretation None      MDM   Final diagnoses:  Kidney stone   Ureterolithiasis with minor AKI. Symptoms improved in ED. Tolerating PO. Will dc with urology follow up as needed and PCP to recheck kidney function after stone passes.   New Prescriptions: Discharge Medication List as of 08/24/2015 11:08 AM    START taking these medications   Details  ondansetron (ZOFRAN ODT) 4 MG disintegrating tablet 4mg  ODT q4 hours prn nausea/vomit, Print    oxyCODONE-acetaminophen (  PERCOCET/ROXICET) 5-325 MG tablet Take 1-2 tablets by mouth every 6 (six) hours as needed for severe pain., Starting 08/24/2015, Until Discontinued, Print    tamsulosin (FLOMAX) 0.4 MG CAPS capsule Take 1 capsule (0.4 mg total) by mouth once., Starting 08/24/2015, Print         I have personally and contemperaneously reviewed labs and imaging and used in my decision making as above.   A medical screening exam was performed and I feel the patient has had an appropriate workup for their chief complaint at this time and likelihood of emergent condition existing is low and thus workup can continue on an outpatient basis.. Their vital signs are stable. They have been counseled on decision, discharge, follow up and which symptoms necessitate immediate  return to the emergency department.  They verbally stated understanding and agreement with plan and discharged in stable condition.      Merrily Pew, MD 08/24/15 [phone]

## 2015-08-24 NOTE — ED Notes (Signed)
Pt informed of ct wait time

## 2015-08-30 ENCOUNTER — Encounter: Payer: Self-pay | Admitting: Cardiovascular Disease

## 2015-08-30 ENCOUNTER — Ambulatory Visit (INDEPENDENT_AMBULATORY_CARE_PROVIDER_SITE_OTHER): Payer: BLUE CROSS/BLUE SHIELD | Admitting: Cardiovascular Disease

## 2015-08-30 VITALS — BP 118/60 | HR 56 | Ht 73.0 in | Wt 202.0 lb

## 2015-08-30 DIAGNOSIS — E785 Hyperlipidemia, unspecified: Secondary | ICD-10-CM

## 2015-08-30 DIAGNOSIS — I25119 Atherosclerotic heart disease of native coronary artery with unspecified angina pectoris: Secondary | ICD-10-CM

## 2015-08-30 DIAGNOSIS — E782 Mixed hyperlipidemia: Secondary | ICD-10-CM | POA: Insufficient documentation

## 2015-08-30 MED ORDER — EZETIMIBE 10 MG PO TABS: 10.0000 mg | ORAL_TABLET | Freq: Every day | ORAL | Status: DC

## 2015-08-30 NOTE — Patient Instructions (Addendum)
Medication Instructions:  START Zetia 10 mg once daily   Labwork: Your physician recommends that you return for lab work in: 1 month for fasting cholesterol panel on Wed. June 7   Testing/Procedures: None Ordered   Follow-Up: Your physician recommends that you schedule a follow-up appointment in: 1 month on Tuesday June 13 at 8:30 am   Your physician wants you to follow-up in: 6 months with Dr. Acie Fredrickson.  You will receive a reminder letter in the mail two months in advance. If you don't receive a letter, please call our office to schedule the follow-up appointment.   If you need a refill on your cardiac medications before your next appointment, please call your pharmacy.   Thank you for choosing [facility]! Christen Bame, RN [phone]

## 2015-08-30 NOTE — Progress Notes (Signed)
Cardiology Office Note   Date:  08/30/2015   ID:  Kenneth Vance, DOB December 05, 1955, MRN [id number]  PCP:  Simona Huh, MD  Cardiologist:   Thayer Headings, MD   No chief complaint on file.  Problem list 1. Coronary artery disease-status post coronary artery bypass grafting-December 2 , 2016  2. Hyperlipidemia   History of Present Illness: Kenneth Vance is a 60 y.o. male who presents for  CAD / CABG Doing much better after CABG Has lost 10 lbs. Walks 3-5 miles a day .   No angina   Has been on a very low fat diet. Has tried numerous statins and did not tolerate it at all. Have prescribed crestor  - has not started it yet.    Has tolerated fenofobrate fairly well.   Aug 30, 2015:  Doing great - except he is not tolerating Crestor  Has tried it twice - severe muscle  Has tried zocor, lipitor, crestor, pravachol, Livalo    Past Medical History  Diagnosis Date  . BCC (basal cell carcinoma), ear   . Hyperlipidemia   . CAD in native artery 03/29/15    severe CAD   . S/P CABG x 4 03/31/15    Past Surgical History  Procedure Laterality Date  . Mandible surgery    . Hydrocele excision    . Cataract extraction Left   . Cardiac catheterization N/A 03/29/2015    Procedure: Left Heart Cath and Coronary Angiography;  Surgeon: Jettie Booze, MD;  Location: [facility];  Service: Cardiovascular;  Laterality: N/A;  . Coronary artery bypass graft N/A 03/31/2015    Procedure: CORONARY ARTERY BYPASS GRAFTING (CABG) X 4 UTILIZING THE LEFT INTERNAL MAMMARY ARTERY  AND RIGHT SAPHENEOUS VEINS .;  Surgeon: Gaye Pollack, MD;  Location: [facility];  Service: Open Heart Surgery;  Laterality: N/A;  . Tee without cardioversion N/A 03/31/2015    Procedure: TRANSESOPHAGEAL ECHOCARDIOGRAM (TEE);  Surgeon: Gaye Pollack, MD;  Location: [facility];  Service: Open Heart Surgery;  Laterality: N/A;     Current Outpatient Prescriptions  Medication Sig Dispense Refill  . aspirin 81 MG EC tablet  Take 1 tablet (81 mg total) by mouth daily.    . rosuvastatin (CRESTOR) 10 MG tablet Take 1 tablet (10 mg total) by mouth every other day. (Patient not taking: Reported on 08/30/2015) 15 tablet 6   No current facility-administered medications for this visit.    Allergies:   Peanut-containing drug products; Fenofibrate; Lipitor; Livalo; Niacin; Niacin and related; Peanut oil; and Zocor    Social History:  The patient  reports that he has never smoked. He does not have any smokeless tobacco history on file. He reports that he drinks alcohol. He reports that he does not use illicit drugs.   Family History:  The patient's family history includes Hyperlipidemia in his mother; Pulmonary fibrosis in his father.    ROS:  Please see the history of present illness.    Review of Systems: Constitutional:  denies fever, chills, diaphoresis, appetite change and fatigue.  HEENT: denies photophobia, eye pain, redness, hearing loss, ear pain, congestion, sore throat, rhinorrhea, sneezing, neck pain, neck stiffness and tinnitus.  Respiratory: denies SOB, DOE, cough, chest tightness, and wheezing.  Cardiovascular: denies chest pain, palpitations and leg swelling.  Gastrointestinal: denies nausea, vomiting, abdominal pain, diarrhea, constipation, blood in stool.  Genitourinary: denies dysuria, urgency, frequency, hematuria, flank pain and difficulty urinating.  Musculoskeletal: denies  myalgias, back pain, joint  swelling, arthralgias and gait problem.   Skin: denies pallor, rash and wound.  Neurological: denies dizziness, seizures, syncope, weakness, light-headedness, numbness and headaches.   Hematological: denies adenopathy, easy bruising, personal or family bleeding history.  Psychiatric/ Behavioral: denies suicidal ideation, mood changes, confusion, nervousness, sleep disturbance and agitation.       All other systems are reviewed and negative.    PHYSICAL EXAM: VS:  BP 118/60 mmHg  Pulse 56  Ht  6\' 1"  (1.854 m)  Wt 202 lb (91.627 kg)  BMI 26.66 kg/m2 , BMI Body mass index is 26.66 kg/(m^2). GEN: Well nourished, well developed, in no acute distress HEENT: normal Neck: no JVD, carotid bruits, or masses Cardiac: RRR; no murmurs, rubs, or gallops,no edema ,  Respiratory:  clear to auscultation bilaterally, normal work of breathing GI: soft, nontender, nondistended, + BS MS: no deformity or atrophy Skin: warm and dry, no rash Neuro:  Strength and sensation are intact Psych: normal   EKG:  EKG is not ordered today. The ekg ordered today demonstrates    Recent Labs: 04/01/2015: Magnesium 2.0 08/24/2015: ALT 15*; BUN 19; Creatinine, Ser 1.29*; Hemoglobin 15.6; Platelets 175; Potassium 4.1; Sodium 142    Lipid Panel    Component Value Date/Time   CHOL 258* 03/28/2015 1009   TRIG 195* 03/28/2015 1009   HDL 46 03/28/2015 1009   CHOLHDL 5.6* 03/28/2015 1009   VLDL 39* 03/28/2015 1009   LDLCALC 173* 03/28/2015 1009      Wt Readings from Last 3 Encounters:  08/30/15 202 lb (91.627 kg)  05/31/15 200 lb (90.719 kg)  05/03/15 195 lb (88.451 kg)      Other studies Reviewed: Additional studies/ records that were reviewed today include: . Review of the above records demonstrates:    ASSESSMENT AND PLAN:  1.  Coronary artery disease:  [name] is doing very well following his coronary artery bypass grafting.  Chest is healing well.   He's not having any episodes of angina.      ASA to 81 mg a day .   2. Hyperlipidemia: He has very high lipids. He's a very low fat diet and exercises quite regular. He does not tolerate Zocor or Lipitor, Livalo He did not tolerate crestor. Has discussed with lipid clinic.  He needs to try Zetia before he would qualify for Repatha  Will start Zetia today .   Will go back to lipid clinic in 1 month and will get repeat labs.   Current medicines are reviewed at length with the patient today.  The patient does not have concerns regarding  medicines.  The following changes have been made:  no change  Labs/ tests ordered today include:  No orders of the defined types were placed in this encounter.     Disposition:   FU with me in 6 months     Mansour Balboa, Wonda Cheng, MD  08/30/2015 8:32 AM    [facility] [place], [place], [place]  [address] Phone: [phone]; Fax: [phone]   [facility]  [address] [place], [place]  [address] [phone]   Fax [phone]

## 2015-10-04 ENCOUNTER — Other Ambulatory Visit (INDEPENDENT_AMBULATORY_CARE_PROVIDER_SITE_OTHER): Payer: BLUE CROSS/BLUE SHIELD | Admitting: *Deleted

## 2015-10-04 DIAGNOSIS — E785 Hyperlipidemia, unspecified: Secondary | ICD-10-CM | POA: Diagnosis not present

## 2015-10-04 LAB — LIPID PANEL
CHOLESTEROL: 202 mg/dL — AB (ref 125–200)
HDL: 45 mg/dL (ref >=40)
LDL CALC: 126 mg/dL (ref <130)
TRIGLYCERIDES: 157 mg/dL — AB (ref <150)
Total CHOL/HDL Ratio: 4.5 Ratio (ref <=5.0)
VLDL: 31 mg/dL — AB (ref <30)

## 2015-10-10 ENCOUNTER — Ambulatory Visit: Payer: BLUE CROSS/BLUE SHIELD | Admitting: Pharmacist

## 2015-10-10 ENCOUNTER — Encounter: Payer: Self-pay | Admitting: Pharmacist

## 2015-10-10 ENCOUNTER — Ambulatory Visit (INDEPENDENT_AMBULATORY_CARE_PROVIDER_SITE_OTHER): Payer: BLUE CROSS/BLUE SHIELD | Admitting: Pharmacist

## 2015-10-10 VITALS — Wt 205.5 lb

## 2015-10-10 DIAGNOSIS — E785 Hyperlipidemia, unspecified: Secondary | ICD-10-CM

## 2015-10-10 NOTE — Patient Instructions (Signed)
It was nice to see you in clinic today.  I will start paperwork for Repatha injections and give you a call once it's approved.  If you have any questions, call Megan in lipid clinic [phone].

## 2015-10-10 NOTE — Progress Notes (Signed)
Patient ID: [name]                 DOB: June 16, 1955                    MRN: [id number]     HPI: Kenneth Vance is a 60 y.o. male patient referred to lipid clinic by Dr. Acie Fredrickson. PMH is significant for CAD s/p CABG in 2016 and HLD. Pt has had a history of statin intolerance and presents for further lipid management.  Pt reports that he has previously tried Crestor, Zocor, Lipitor, pravastatin, Livalo, and fenofibrate. He reports that myalgias would start with each of these after about a week. He was training for a marathon during this time and was not sure if the symptoms were related to that or to his statin use. However, upon statin d/c, his symptoms resolved. Most recently tried Zetia and experienced myalgias after about a month of use. Most recent lipid panel drawn a few days after Zetia d/c - still seeing effects from drug.  Current Medications: Zetia 10mg  daily - stopped taking about a week ago d/t myalgias Intolerances: Crestor 10mg  every other day, simvastatin, atorvastatin, pravastatin, Livalo, fenofibrate, Zetia 10mg  daily - myalgias with all that resolved upon discontinuation. Risk Factors: CAD s/p CABG 03/2015 LDL goal: < 70mg /dL  Diet: Low fat diet, eats frilled fish and grilled chicken very frequently. Avoids fried food.  Exercise: Walks 3-5 miles a day, wants to start running again.  Family History: Hyperlipidemia in his mother, pulmonary fibrosis and CABG x7 in his father. 4 uncles on his mother's side died in their 58s from heart disease.  Social History: The patient  reports that he has never smoked. He does not have any smokeless tobacco history on file. He reports that he drinks alcohol. He reports that he does not use illicit drugs.   Labs: 10/04/15: TC 202, TG 157, HDL 45, LDL 126 (on Zetia 10mg  daily - had d/c'ed a few days prior to bloodwork being drawn) 05/11/15: TC 258, TG 195, HDL 46, LDL 172 (no therapy)  Past Medical History  Diagnosis Date  . BCC (basal cell  carcinoma), ear   . Hyperlipidemia   . CAD in native artery 03/29/15    severe CAD   . S/P CABG x 4 03/31/15    Current Outpatient Prescriptions on File Prior to Visit  Medication Sig Dispense Refill  . aspirin 81 MG EC tablet Take 1 tablet (81 mg total) by mouth daily.    Marland Kitchen ezetimibe (ZETIA) 10 MG tablet Take 1 tablet (10 mg total) by mouth daily. 30 tablet 11  . rosuvastatin (CRESTOR) 10 MG tablet Take 1 tablet (10 mg total) by mouth every other day. (Patient not taking: Reported on 08/30/2015) 15 tablet 6   No current facility-administered medications on file prior to visit.    Allergies  Allergen Reactions  . Peanut-Containing Drug Products   . Fenofibrate   . Lipitor [Atorvastatin] Other (See Comments)    myalgia  . Livalo [Pitavastatin] Other (See Comments)    myalgia  . Niacin Other (See Comments)  . Niacin And Related Other (See Comments)    Flushing, feels weak   . Peanut Oil Other (See Comments)  . Zocor [Simvastatin] Other (See Comments)    myalgia    Assessment/Plan:  1. Hyperlipidemia - LDL above goal < 70mg /dL at 126mg /dL given history of CAD and CABG. Pt intolerant to 5 statins including Crestor 10mg  every other day.  Discussed benefits, side effect profile, and injection technique of PCSK9i. Will start paperwork for Repatha injections.   Megan E. Supple, PharmD, [place] [id number] [place], [place] [address] Phone: [phone]; Fax: [phone] 10/10/2015 10:28 AM

## 2015-10-11 ENCOUNTER — Other Ambulatory Visit: Payer: Self-pay | Admitting: Pharmacist

## 2015-10-11 MED ORDER — EVOLOCUMAB 140 MG/ML ~~LOC~~ SOAJ: 1.0000 "pen " | SUBCUTANEOUS | Status: DC

## 2015-10-23 ENCOUNTER — Telehealth: Payer: Self-pay | Admitting: Cardiovascular Disease

## 2015-10-23 NOTE — Telephone Encounter (Signed)
New message     The nurse had submitted a application for medication for the pt and placed the wrong diagnoses in the application and the insurance company denied the medication.

## 2015-10-23 NOTE — Telephone Encounter (Signed)
Spoke with patient who states he received a letter from [employer] stating Repatha has been denied because of an incorrect diagnosis - he states diagnosis is CAD.  I advised that I will forward message to [place] for follow-up.  He verbalized understanding and agreement and thanked me for the call.

## 2015-10-23 NOTE — Telephone Encounter (Signed)
Left message for patient to call back  

## 2015-10-23 NOTE — Telephone Encounter (Signed)
Follow Up:; ° ° °Returning your call. °

## 2015-10-23 NOTE — Telephone Encounter (Signed)
PA submitted correctly for Repatha with ASCVD indication. For some reason, insurance denied it b/c patient doesn't have FH. This has not been an issue with insurance companies since [place] were approved on the market. Both are FDA approved indications. Sent an appeals letter last week and are waiting for the response. Updated pt with this information. Sounds like he received original denial letter before appeals letter was sent. He will call if it was dated after 6/22 when appeals letter was sent. Would need to go for clinical peer to peer review then.

## 2015-11-09 ENCOUNTER — Telehealth: Payer: Self-pay | Admitting: Pharmacist

## 2015-11-09 NOTE — Telephone Encounter (Signed)
Kenneth Vance is calling about his Repatha , he says that it was Denied and is wanting to speak to someone .  Thanks

## 2015-11-16 NOTE — Telephone Encounter (Signed)
Spoke with pt and informed him that we have sent in a prior authorizations, 2 appeals letters, and most recently a letter of medical necessity. Denial pt received was for 2nd appeals letter. Still waiting to hear back on response for letter of medical necessity.

## 2016-03-25 ENCOUNTER — Telehealth: Payer: Self-pay | Admitting: Cardiovascular Disease

## 2016-03-25 MED ORDER — EVOLOCUMAB 140 MG/ML ~~LOC~~ SOAJ: 1.0000 "pen " | SUBCUTANEOUS | 3 refills | Status: DC

## 2016-03-25 NOTE — Telephone Encounter (Signed)
Routing to [place] who follows patient in lipid clinic

## 2016-03-25 NOTE — Telephone Encounter (Signed)
°*  STAT* If patient is at the pharmacy, call can be transferred to refill team.   1. Which medications need to be refilled? (please list name of each medication and dose if known) Repatha ( needs a new prescription called in )   2. Which pharmacy/location (including street and city if local pharmacy) is medication to be sent to?[facility] in [facility]   3. Do they need a 30 day or 90 day supply? [place]

## 2016-03-25 NOTE — Telephone Encounter (Signed)
90 day supply of Repatha sent to pt's local pharmacy.

## 2016-04-12 ENCOUNTER — Telehealth: Payer: Self-pay | Admitting: Pharmacist Clinician (PhC)/ Clinical Pharmacy Specialist

## 2016-04-12 DIAGNOSIS — E785 Hyperlipidemia, unspecified: Secondary | ICD-10-CM

## 2016-04-12 NOTE — Telephone Encounter (Signed)
Patient received temporary denial from [place] because they want another denial from [employer].  I believe that they are looking for a denial in 2018, before considering the patient for free medication for the year.  Patient will go to lab next week for repeat lipid labs and we can re-submit to [employer] on Jan 1, then go to [employer].

## 2016-04-15 NOTE — Telephone Encounter (Signed)
[employer] has all the info they need - they are in the process of conducting a benefits investigation. Advised pt that he does not need to come in tomorrow for labwork and to give [place] a call if he has not heart back from [employer] within the next few weeks with a decision. He verbalized understanding.

## 2016-04-16 ENCOUNTER — Other Ambulatory Visit: Payer: BLUE CROSS/BLUE SHIELD

## 2016-05-21 ENCOUNTER — Other Ambulatory Visit: Payer: BLUE CROSS/BLUE SHIELD

## 2016-05-23 ENCOUNTER — Other Ambulatory Visit: Payer: BLUE CROSS/BLUE SHIELD | Admitting: *Deleted

## 2016-05-23 DIAGNOSIS — I10 Essential (primary) hypertension: Secondary | ICD-10-CM

## 2016-05-23 DIAGNOSIS — E785 Hyperlipidemia, unspecified: Secondary | ICD-10-CM

## 2016-05-24 ENCOUNTER — Telehealth: Payer: Self-pay | Admitting: Pharmacist

## 2016-05-24 LAB — LIPID PANEL
CHOLESTEROL TOTAL: 250 mg/dL — AB (ref 100–199)
Chol/HDL Ratio: 5.2 ratio units — ABNORMAL HIGH (ref 0.0–5.0)
HDL: 48 mg/dL (ref >39)
LDL CALC: 170 mg/dL — AB (ref 0–99)
Triglycerides: 158 mg/dL — ABNORMAL HIGH (ref 0–149)
VLDL Cholesterol Cal: 32 mg/dL (ref 5–40)

## 2016-05-24 NOTE — Telephone Encounter (Signed)
Have been trying to get patient on PCSK9i for over a year. His [place] plan did not cover the ASCVD indication in 2017 despite 2 prior authorizations and 2 appeals letters. Originally tried to submit to [employer] for coverage and they denied. Then tried for Repatha coverage through [employer] and had at least 4 phone calls with them because they required our final appeals denial letter. Sent this over and they still denied it stating that if a prior authorization was met, pt would be covered.  In hopes that pt's plan has been updated in 2018 for appropriate PCKS9i coverage, tried submitting another prior authorization online. This was not processed because the prior authorization was sent to [employer] PBM automatically which apparently isn't the patient's PBM. Called patient's insurance company who stated that [employer] is the patient's PBM. Called [employer] who stated that they could not find the pt in their system. They gave me another number for [facility]. Called them and they could not find the patient in their system either. Called patient's insurance plan again and they transferred me to [facility] who apparently covers medications. Called them and they stated that they do not have PCSK9i on their list of medications they handle. Called patient's insurance plan for a third time and they stated that [employer] is the PBM. Asked why I was given incorrect information with the wrong PBM when I initially called them and they could not tell me.  After spending an hour on the phone being transferred incorrectly just to locate the patient's PBM which should be printed on his ID card in the first place, sent in another prior authorization. Will see if this one goes through.

## 2016-06-04 NOTE — Telephone Encounter (Signed)
Called [employer] for an update and they apparently canceled the prior authorization for no reason without informing us. Representative could not tell me why this was done. Had to submit another PA over the phone which took an additional 20 minutes. Should receive a response within 72 hours.

## 2016-06-19 NOTE — Telephone Encounter (Signed)
Prior authorization was denied because [employer] staff completed it incorrectly. Appeals letter was sent on 2/12. No response as of 2/21 so called for an update and was told the appeals is in review and will take an additional 15-30 days.

## 2016-06-27 NOTE — Telephone Encounter (Signed)
Spent another 30 minutes on hold calling [employer] for an update on Repatha appeals letter sent 06/10/16. Was told by a representative that the appeals was denied because patient does not have FH. This does not make sense since the initial PA had questions for ASCVD indication and I was previously told by his insurance that ASCVD is covered. Asked to speak with clinical staff and was told this is not possible. Apparently [employer] does not offer peer to peer reviews either. Staff attempted an external review but a second level appeals needs to be completed. There is no new clinical literature to support his request. The request is for an FDA approved indication and patient has tried all appropriate therapies. I will send in an appeals letter for the 4th time.

## 2016-07-11 NOTE — Telephone Encounter (Signed)
Send urgent 2nd appeals letter on 3/6 and clarified I should receive a response with 72 hours. Never received anything as of 3/15 so I called for an update. They stated they never got the appeals letter. This is impossible since I received a response after sending the appeals letter asking for patient address which I then clarified and faxed back. Representative could not explain this to me. He stated that the patient's member ID does not match what they have on file. They gave me a completely different member ID than what we have - [id number]. The rep sent over clinical criteria questions to attach to the 2nd appeals that I need to resend now.

## 2016-07-23 ENCOUNTER — Telehealth: Payer: Self-pay | Admitting: Pharmacist

## 2016-07-23 DIAGNOSIS — E7849 Other hyperlipidemia: Secondary | ICD-10-CM

## 2016-07-23 NOTE — Telephone Encounter (Signed)
Pt presented to sign paperwork for external appeals and [employer] as a back up. Pt also self-administered first Repatha injection into the abdomen with no complaints. Provided with 2 sample pens and will f/u with lab work in 6 weeks.

## 2016-08-26 MED ORDER — EVOLOCUMAB 140 MG/ML ~~LOC~~ SOAJ: 1.0000 "pen " | SUBCUTANEOUS | 11 refills | Status: DC

## 2016-08-26 NOTE — Telephone Encounter (Addendum)
External appeal finally approved although we never received notification. Called [employer] and they will send over approval from 08/01/16 for Mazie. Per his insurance, preferred specialty pharmacy is [facility]. Rx sent in, [facility] for pt.

## 2016-08-26 NOTE — Addendum Note (Signed)
Addended by: SUPPLE, MEGAN E on: 08/26/2016 02:32 PM   Modules accepted: Orders

## 2016-08-28 NOTE — Telephone Encounter (Signed)
Spoke with pt, he states he received a letter of approval earlier this month, did not call to let us know. States he has done 3 injections so far and has developed some muscle and joint aches. He would like to move up his lipid panel check to this week to see how his cholesterol looks since he is not sure if the aches will be tolerable in the long run. Lipid panel moved to 08/29/16 to assess Repatha efficacy.

## 2016-08-29 ENCOUNTER — Other Ambulatory Visit: Payer: BLUE CROSS/BLUE SHIELD | Admitting: *Deleted

## 2016-08-29 ENCOUNTER — Encounter (INDEPENDENT_AMBULATORY_CARE_PROVIDER_SITE_OTHER): Payer: Self-pay

## 2016-08-29 DIAGNOSIS — E7849 Other hyperlipidemia: Secondary | ICD-10-CM

## 2016-08-29 LAB — LIPID PANEL
CHOLESTEROL TOTAL: 153 mg/dL (ref 100–199)
Chol/HDL Ratio: 3.5 ratio (ref 0.0–5.0)
HDL: 44 mg/dL (ref >39)
LDL Calculated: 56 mg/dL (ref 0–99)
Triglycerides: 263 mg/dL — ABNORMAL HIGH (ref 0–149)
VLDL CHOLESTEROL CAL: 53 mg/dL — AB (ref 5–40)

## 2016-08-29 LAB — HEPATIC FUNCTION PANEL
ALBUMIN: 4.1 g/dL (ref 3.5–5.5)
ALK PHOS: 58 IU/L (ref 39–117)
ALT: 12 IU/L (ref 0–44)
AST: 16 IU/L (ref 0–40)
BILIRUBIN, DIRECT: 0.14 mg/dL (ref 0.00–0.40)
Bilirubin Total: 0.4 mg/dL (ref 0.0–1.2)
TOTAL PROTEIN: 6.2 g/dL (ref 6.0–8.5)

## 2016-09-09 ENCOUNTER — Other Ambulatory Visit: Payer: BLUE CROSS/BLUE SHIELD

## 2016-12-04 IMAGING — CR DG CHEST 1V PORT
1 series · 1 of 1 positions shown · non-contrast
Comparison: None.

CLINICAL DATA: Preoperative for CABG.

EXAM:
PORTABLE CHEST 1 VIEW

[AP]
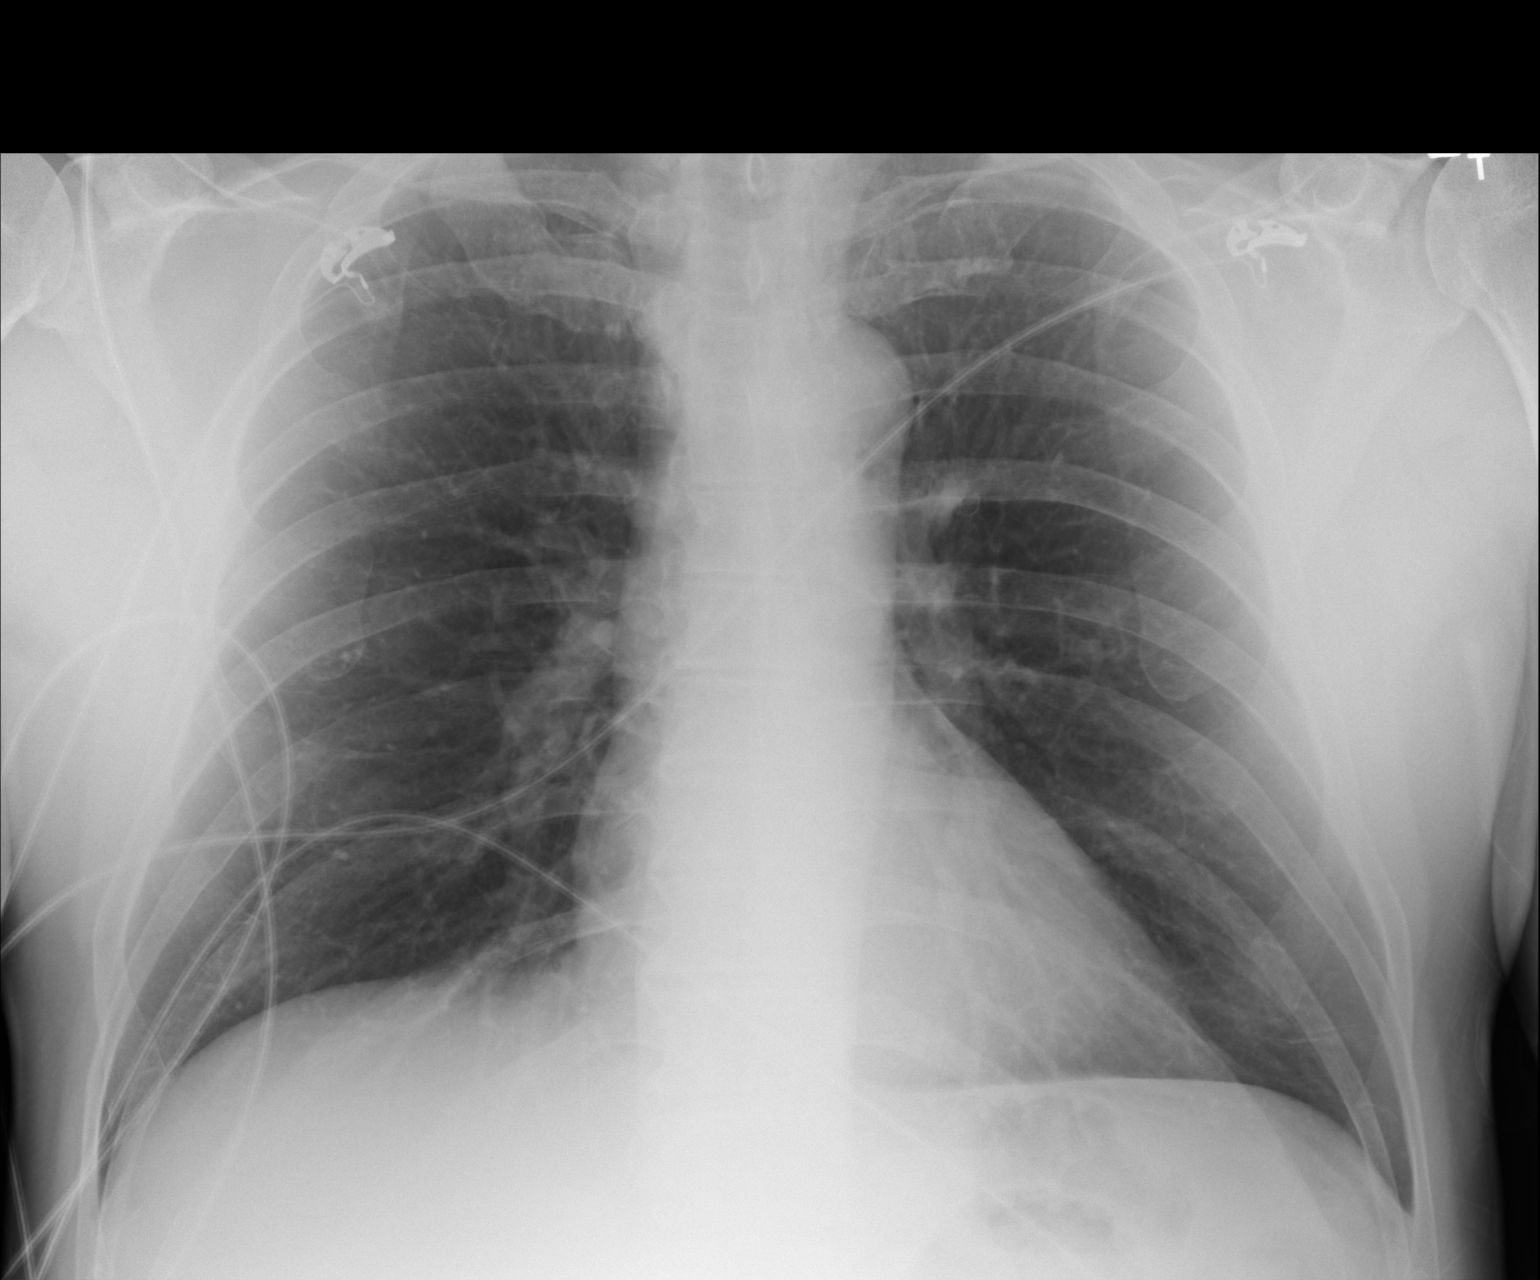

[1 of 1 positions shown; findings below may reference images not displayed]

FINDINGS: Normal heart size. Normal mediastinal contour. No pneumothorax. No
pleural effusion. Clear lungs, with no focal lung consolidation and
no pulmonary edema.
IMPRESSION: No active disease.

## 2016-12-05 IMAGING — CR DG CHEST 1V PORT
1 series · 1 of 1 positions shown · non-contrast
Comparison: 03/30/2015

CLINICAL DATA: Status post CABG

EXAM:
PORTABLE CHEST 1 VIEW

[AP]
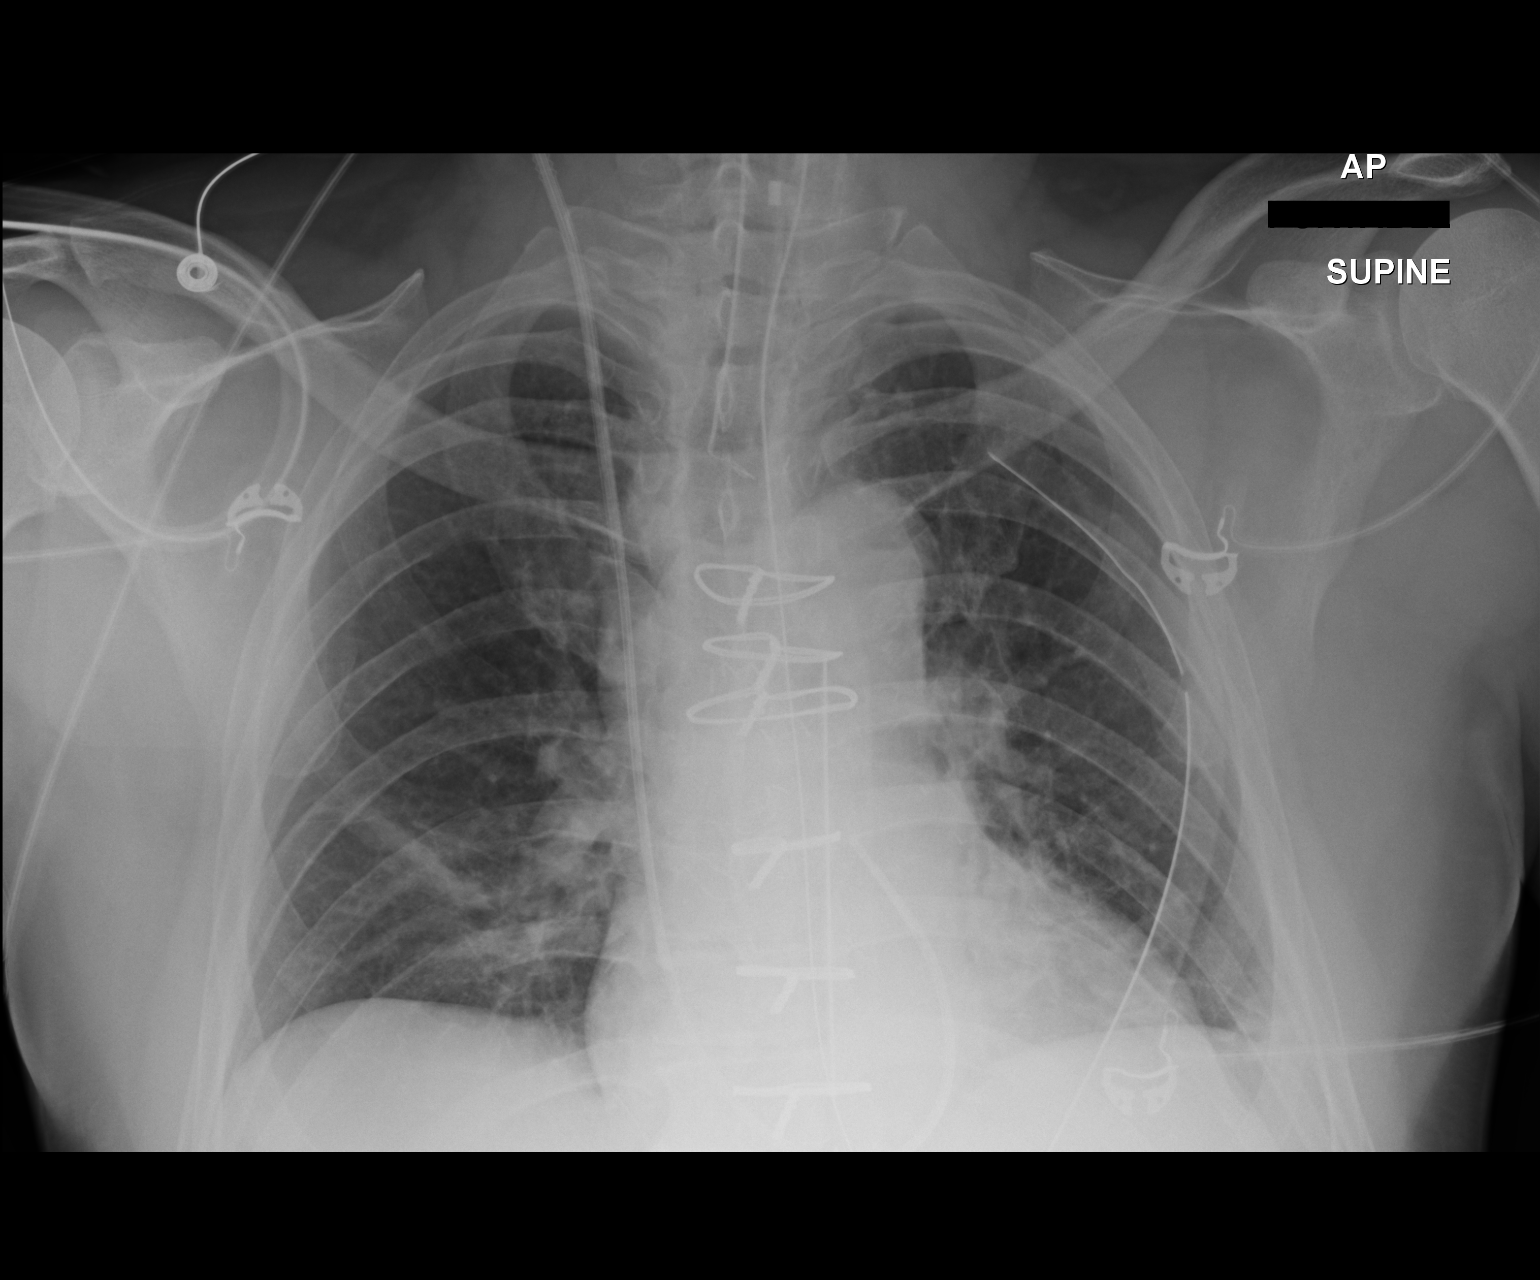

[1 of 1 positions shown; findings below may reference images not displayed]

FINDINGS: Endotracheal tube with the tip 4.8 cm above the carina. Nasogastric
tube coursing below the diaphragm. Mediastinal drains are present.
Swan-Ganz catheter with the tip projecting over the right
ventricular outflow tract.

Left-sided chest tube without a pneumothorax. Mild bibasilar
atelectasis. No focal consolidation. No pleural effusion. Interval
median sternotomy. Stable cardiomediastinal silhouette. No acute
osseous abnormality.
IMPRESSION: 1. Endotracheal tube with the tip 4.8 cm above the carina.
2. Swan-Ganz catheter with the tip projecting over the right
ventricular outflow tract.
3. Left-sided chest tube without a pneumothorax.

## 2016-12-06 IMAGING — CR DG CHEST 1V PORT
1 series · 1 of 1 positions shown · non-contrast
Comparison: Yesterday

CLINICAL DATA: Status post CABG

EXAM:
PORTABLE CHEST 1 VIEW

[AP]
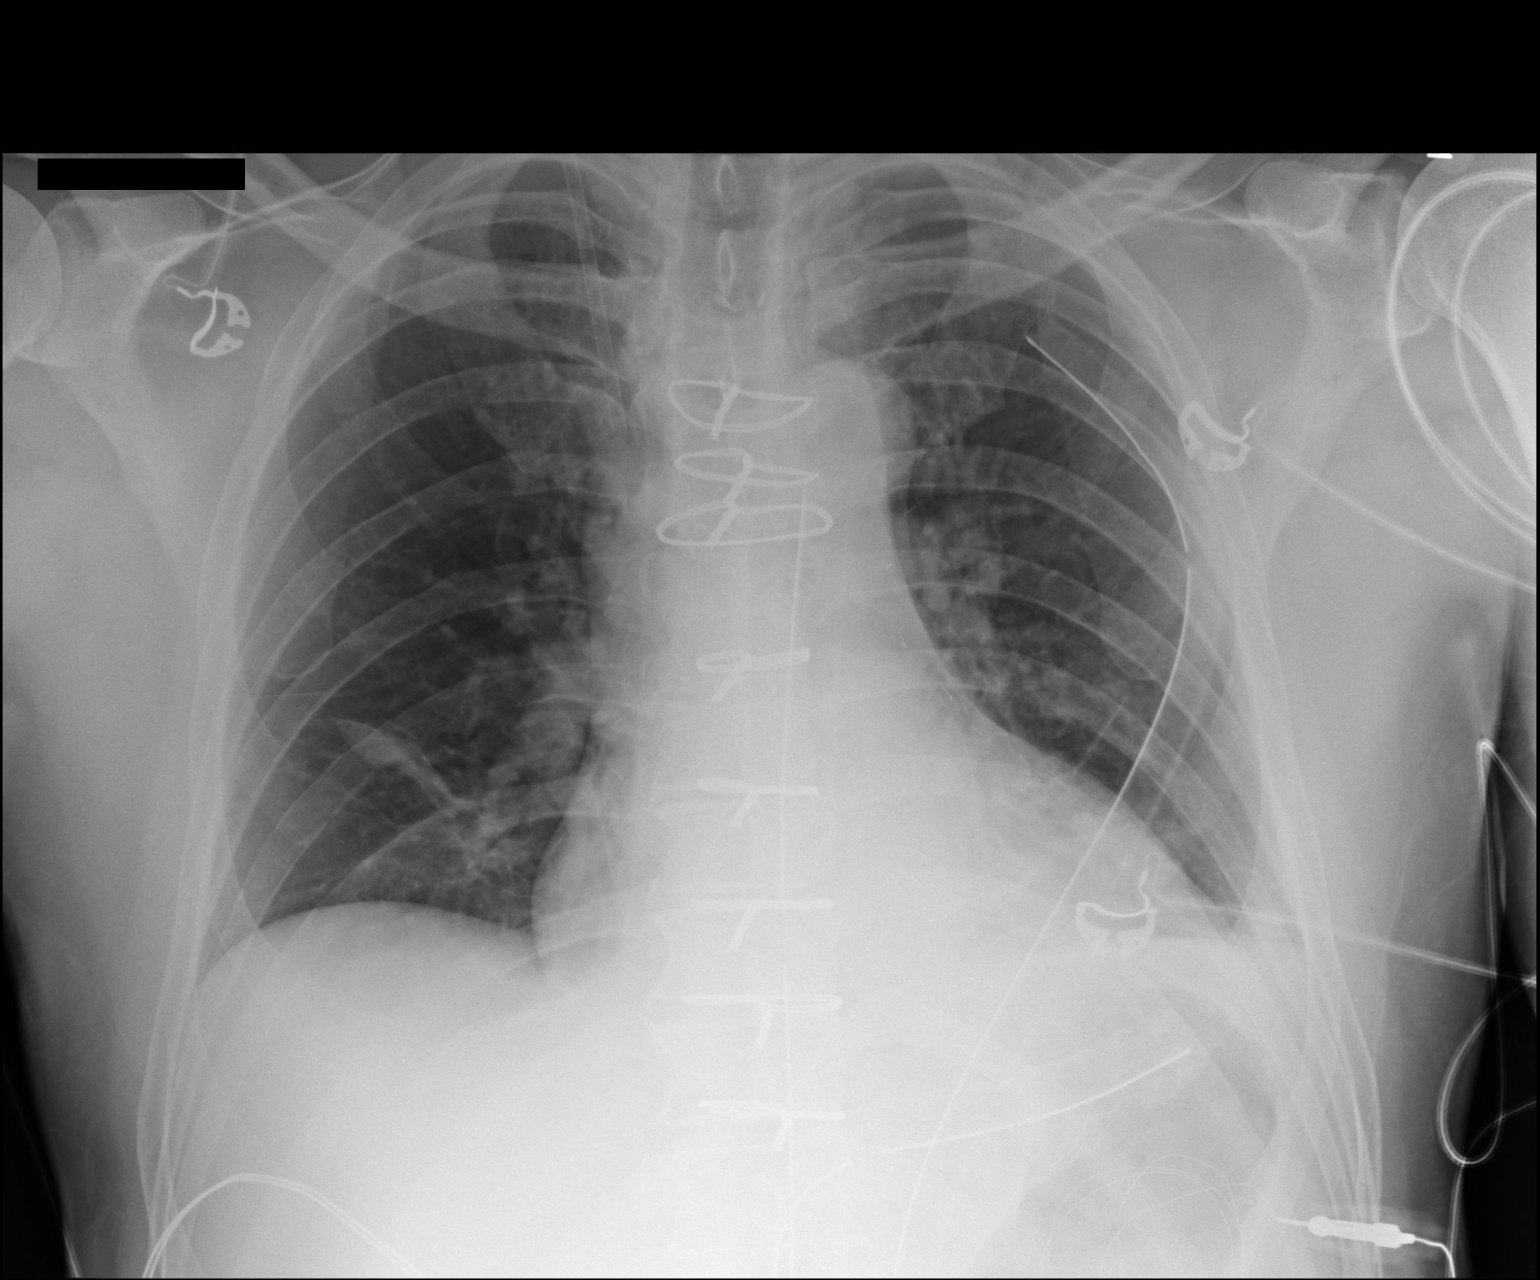

[1 of 1 positions shown; findings below may reference images not displayed]

FINDINGS: Swan-Ganz catheter removal with tracheal and esophageal extubation.
Stable postoperative heart size and mediastinal contours status post
CABG. Stable bibasilar atelectasis. No significant pleural effusion.
No pneumothorax.
IMPRESSION: Stable postoperative atelectasis. Remaining tubes and line in stable
position.

## 2016-12-07 IMAGING — CR DG CHEST 1V PORT
1 series · 1 of 1 positions shown · non-contrast
Comparison: 03/31/2015 and 04/01/2015.

CLINICAL DATA: Postop CABG.  Follow up atelectasis.

EXAM:
PORTABLE CHEST 1 VIEW

[AP]
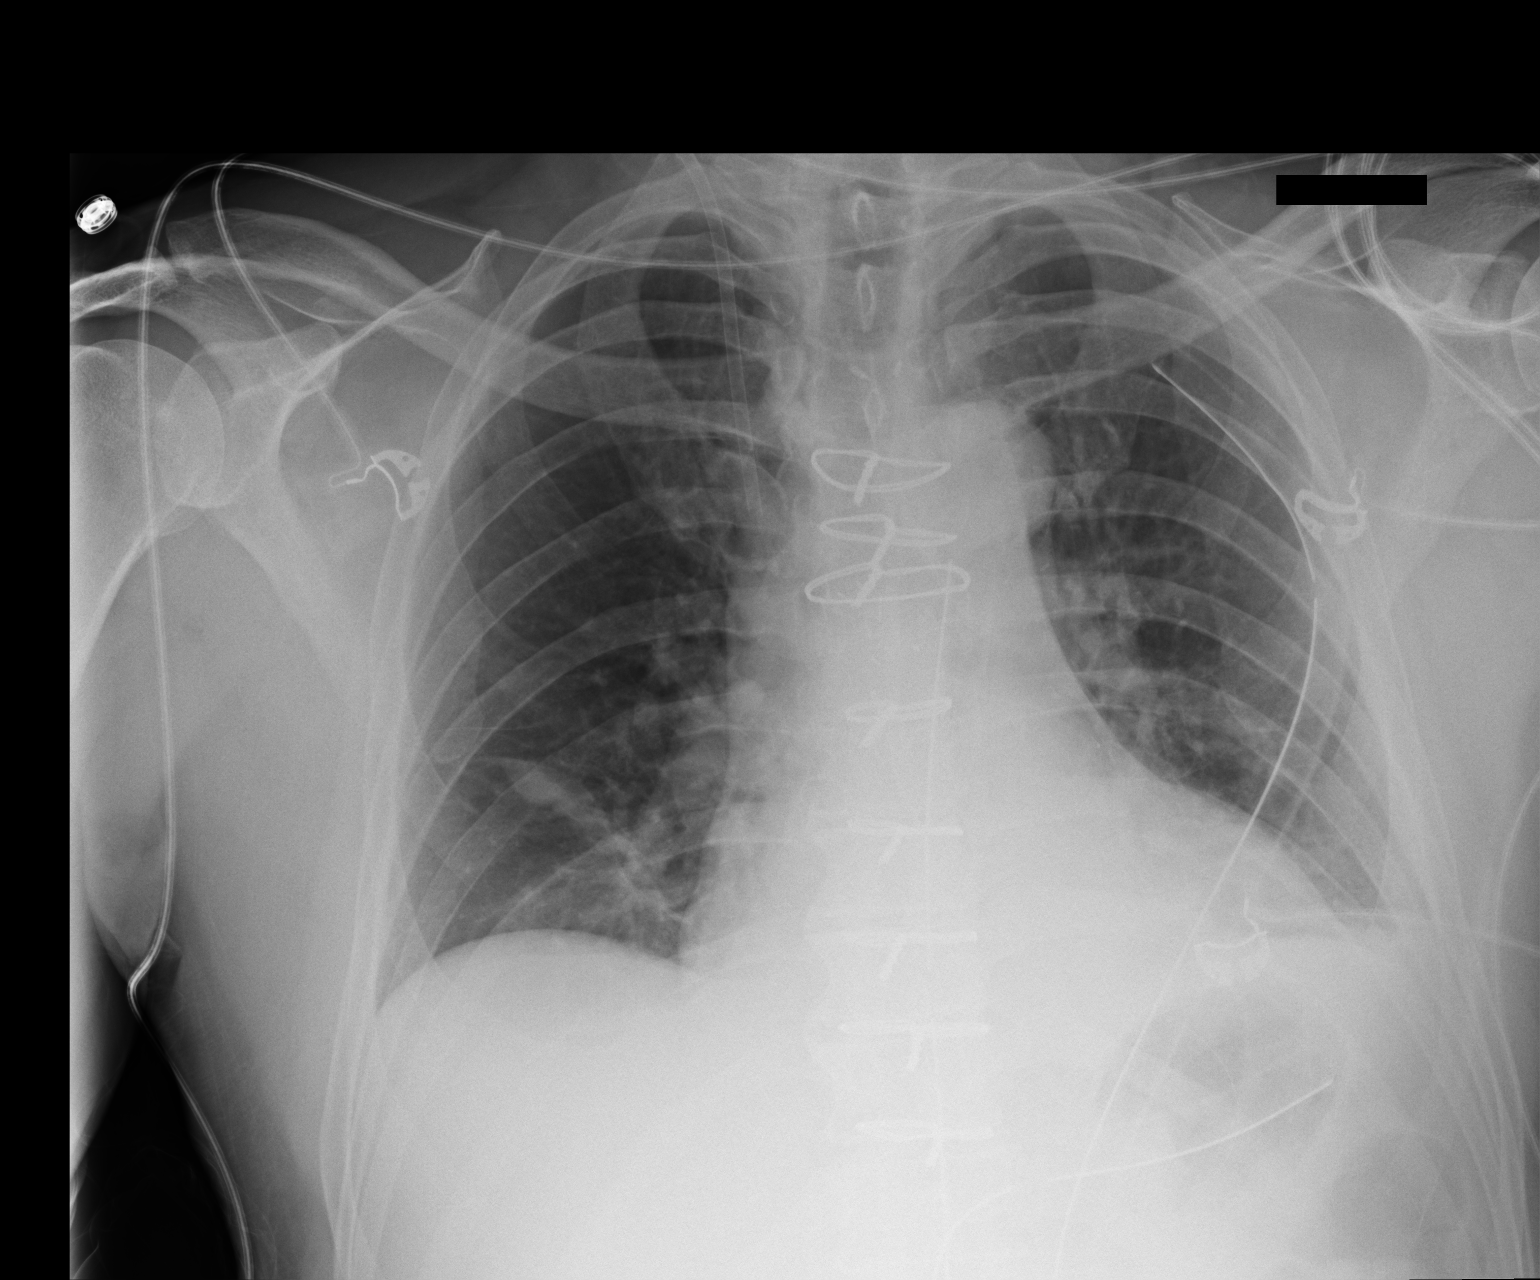

[1 of 1 positions shown; findings below may reference images not displayed]

FINDINGS: 9072 hours. Right IJ sheath, left chest tube and mediastinal drains
are unchanged. The heart size and mediastinal contours are stable.
There is stable bibasilar atelectasis. No evidence of pneumothorax
or significant pleural effusion.
IMPRESSION: Stable postoperative chest with mild bibasilar atelectasis. No
evidence of pneumothorax.

## 2017-01-23 ENCOUNTER — Other Ambulatory Visit: Payer: Self-pay | Admitting: Urology

## 2017-01-27 ENCOUNTER — Encounter (HOSPITAL_COMMUNITY): Payer: Self-pay | Admitting: *Deleted

## 2017-01-28 ENCOUNTER — Encounter (HOSPITAL_COMMUNITY): Payer: Self-pay | Admitting: *Deleted

## 2017-01-29 ENCOUNTER — Other Ambulatory Visit: Payer: Self-pay

## 2017-01-29 ENCOUNTER — Encounter (HOSPITAL_COMMUNITY)
Admission: RE | Admit: 2017-01-29 | Discharge: 2017-01-29 | Disposition: A | Discharge status: Home / Self Care | Payer: BLUE CROSS/BLUE SHIELD | Source: Ambulatory Visit | Attending: Urology | Admitting: Urology

## 2017-01-29 DIAGNOSIS — N200 Calculus of kidney: Secondary | ICD-10-CM | POA: Diagnosis present

## 2017-01-29 DIAGNOSIS — Z85828 Personal history of other malignant neoplasm of skin: Secondary | ICD-10-CM | POA: Diagnosis not present

## 2017-01-29 DIAGNOSIS — Z951 Presence of aortocoronary bypass graft: Secondary | ICD-10-CM | POA: Diagnosis not present

## 2017-01-29 DIAGNOSIS — Z87442 Personal history of urinary calculi: Secondary | ICD-10-CM | POA: Diagnosis not present

## 2017-01-29 NOTE — Discharge Instructions (Signed)
Lithotripsy, Care After °This sheet gives you information about how to care for yourself after your procedure. Your health care provider may also give you more specific instructions. If you have problems or questions, contact your health care provider. °What can I expect after the procedure? °After the procedure, it is common to have: °· Some blood in your urine. This should only last for a few days. °· Soreness in your back, sides, or upper abdomen for a few days. °· Blotches or bruises on your back where the pressure wave entered the skin. °· Pain, discomfort, or nausea when pieces (fragments) of the kidney stone move through the tube that carries urine from the kidney to the bladder (ureter). Stone fragments may pass soon after the procedure, but they may continue to pass for up to 4-8 weeks. °? If you have severe pain or nausea, contact your health care provider. This may be caused by a large stone that was not broken up, and this may mean that you need more treatment. °· Some pain or discomfort during urination. °· Some pain or discomfort in the lower abdomen or (in men) at the base of the penis. ° °Follow these instructions at home: °Medicines °· Take over-the-counter and prescription medicines only as told by your health care provider. °· If you were prescribed an antibiotic medicine, take it as told by your health care provider. Do not stop taking the antibiotic even if you start to feel better. °· Do not drive for 24 hours if you were given a medicine to help you relax (sedative). °· Do not drive or use heavy machinery while taking prescription pain medicine. °Eating and drinking °· Drink enough water and fluids to keep your urine clear or pale yellow. This helps any remaining pieces of the stone to pass. It can also help prevent new stones from forming. °· Eat plenty of fresh fruits and vegetables. °· Follow instructions from your health care provider about eating and drinking restrictions. You may be  instructed: °? To reduce how much salt (sodium) you eat or drink. Check ingredients and nutrition facts on packaged foods and beverages. °? To reduce how much meat you eat. °· Eat the recommended amount of calcium for your age and gender. Ask your health care provider how much calcium you should have. °General instructions °· Get plenty of rest. °· Most people can resume normal activities 1-2 days after the procedure. Ask your health care provider what activities are safe for you. °· If directed, strain all urine through the strainer that was provided by your health care provider. °? Keep all fragments for your health care provider to see. Any stones that are found may be sent to a medical lab for examination. The stone may be as small as a grain of salt. °· Keep all follow-up visits as told by your health care provider. This is important. °Contact a health care provider if: °· You have pain that is severe or does not get better with medicine. °· You have nausea that is severe or does not go away. °· You have blood in your urine longer than your health care provider told you to expect. °· You have more blood in your urine. °· You have pain during urination that does not go away. °· You urinate more frequently than usual and this does not go away. °· You develop a rash or any other possible signs of an allergic reaction. °Get help right away if: °· You have severe pain in   your back, sides, or upper abdomen. °· You have severe pain while urinating. °· Your urine is very dark red. °· You have blood in your stool (feces). °· You cannot pass any urine at all. °· You feel a strong urge to urinate after emptying your bladder. °· You have a fever or chills. °· You develop shortness of breath, difficulty breathing, or chest pain. °· You have severe nausea that leads to persistent vomiting. °· You faint. °Summary °· After this procedure, it is common to have some pain, discomfort, or nausea when pieces (fragments) of the  kidney stone move through the tube that carries urine from the kidney to the bladder (ureter). If this pain or nausea is severe, however, you should contact your health care provider. °· Most people can resume normal activities 1-2 days after the procedure. Ask your health care provider what activities are safe for you. °· Drink enough water and fluids to keep your urine clear or pale yellow. This helps any remaining pieces of the stone to pass, and it can help prevent new stones from forming. °· If directed, strain your urine and keep all fragments for your health care provider to see. Fragments or stones may be as small as a grain of salt. °· Get help right away if you have severe pain in your back, sides, or upper abdomen or have severe pain while urinating. °This information is not intended to replace advice given to you by your health care provider. Make sure you discuss any questions you have with your health care provider. °Document Released: 05/05/2007 Document Revised: 03/06/2016 Document Reviewed: 03/06/2016 °Elsevier Interactive Patient Education © 2017 Else[employer] Inc[employer] ° °

## 2017-01-30 ENCOUNTER — Encounter (HOSPITAL_COMMUNITY)
Admission: RE | Disposition: A | Discharge status: Home / Self Care | Payer: Self-pay | Source: Ambulatory Visit | Attending: Urology

## 2017-01-30 ENCOUNTER — Ambulatory Visit (HOSPITAL_BASED_OUTPATIENT_CLINIC_OR_DEPARTMENT_OTHER)
Admission: RE | Admit: 2017-01-30 | Discharge: 2017-01-30 | Disposition: A | Discharge status: Home / Self Care | Payer: BLUE CROSS/BLUE SHIELD | Source: Ambulatory Visit | Attending: Urology | Admitting: Urology

## 2017-01-30 ENCOUNTER — Ambulatory Visit (HOSPITAL_COMMUNITY): Payer: BLUE CROSS/BLUE SHIELD

## 2017-01-30 ENCOUNTER — Encounter (HOSPITAL_COMMUNITY): Payer: Self-pay | Admitting: General Practice

## 2017-01-30 DIAGNOSIS — Z85828 Personal history of other malignant neoplasm of skin: Secondary | ICD-10-CM | POA: Insufficient documentation

## 2017-01-30 DIAGNOSIS — Z951 Presence of aortocoronary bypass graft: Secondary | ICD-10-CM | POA: Insufficient documentation

## 2017-01-30 DIAGNOSIS — N200 Calculus of kidney: Secondary | ICD-10-CM | POA: Diagnosis not present

## 2017-01-30 DIAGNOSIS — Z87442 Personal history of urinary calculi: Secondary | ICD-10-CM | POA: Insufficient documentation

## 2017-01-30 HISTORY — DX: Pneumonia, unspecified organism: J18.9

## 2017-01-30 HISTORY — DX: Personal history of urinary calculi: Z87.442

## 2017-01-30 HISTORY — PX: EXTRACORPOREAL SHOCK WAVE LITHOTRIPSY: SHX1557

## 2017-01-30 SURGERY — Surgical Case
Anesthesia: *Unknown

## 2017-01-30 SURGERY — LITHOTRIPSY, ESWL
Anesthesia: LOCAL | Laterality: Right

## 2017-01-30 MED ORDER — OXYCODONE HCL 10 MG PO TABS: 10.0000 mg | ORAL_TABLET | ORAL | 0 refills | Status: DC | PRN

## 2017-01-30 MED ORDER — DIPHENHYDRAMINE HCL 25 MG PO CAPS
25.0000 mg | ORAL_CAPSULE | ORAL | Status: AC
  Administered 2017-01-30: 25 mg via ORAL
  Filled 2017-01-30: qty 1

## 2017-01-30 MED ORDER — SODIUM CHLORIDE 0.9 % IV SOLN
INTRAVENOUS | Status: DC
  Administered 2017-01-30: 07:00:00 via INTRAVENOUS

## 2017-01-30 MED ORDER — TAMSULOSIN HCL 0.4 MG PO CAPS: 0.4000 mg | ORAL_CAPSULE | ORAL | 0 refills | Status: DC

## 2017-01-30 MED ORDER — CIPROFLOXACIN HCL 500 MG PO TABS
500.0000 mg | ORAL_TABLET | ORAL | Status: AC
  Administered 2017-01-30: 500 mg via ORAL
  Filled 2017-01-30: qty 1

## 2017-01-30 MED ORDER — DIAZEPAM 5 MG PO TABS
10.0000 mg | ORAL_TABLET | ORAL | Status: AC
  Administered 2017-01-30: 10 mg via ORAL
  Filled 2017-01-30: qty 2

## 2017-01-30 NOTE — H&P (Signed)
HPI: Kenneth Vance is a 61 year-old male patient with a right renal calculus.  The problem is on both sides. He first stated noticing pain on approximately 11/27/2016. This is not his first kidney stone. He has had 1 stones prior to getting this one. He is not currently having flank pain, back pain, groin pain, nausea, vomiting, fever or chills. He has not caught a stone in his urine strainer since his symptoms began.   He has never had surgical treatment for calculi in the past.   The patient has a history of nephrolithiasis. He passed a kidney stone about one year ago. About one month ago he developed pain and felt like he was passing a stone. As far as he knows He never passed anything , but his pain has resolved. Denies any gross hematuria.     ALLERGIES: Statin Drugs    MEDICATIONS: None   GU PSH: None   NON-GU PSH: CABG (coronary artery bypass grafting) Shoulder Surgery (Unspecified)    GU PMH: None   NON-GU PMH: Coronary Artery Disease Hypercholesterolemia Skin Cancer, History    FAMILY HISTORY: None   SOCIAL HISTORY: Marital Status: Married Preferred Language: English; Race: White Current Smoking Status: Patient has never smoked.   Tobacco Use Assessment Completed: Used Tobacco in last 30 days? Drinks 2 drinks per day.  Drinks 2 caffeinated drinks per day. Patient's occupation Brewing technologist.     Notes: 2 daughters    REVIEW OF SYSTEMS:    GU Review Male:   Patient denies frequent urination, hard to postpone urination, burning/ pain with urination, get up at night to urinate, leakage of urine, stream starts and stops, trouble starting your stream, have to strain to urinate , erection problems, and penile pain.  Gastrointestinal (Upper):   Patient denies nausea, vomiting, and indigestion/ heartburn.  Gastrointestinal (Lower):   Patient denies diarrhea and constipation.  Constitutional:   Patient denies fever, night sweats, weight loss, and fatigue.  Skin:   Patient  denies skin rash/ lesion and itching.  Eyes:   Patient denies blurred vision and double vision.  Ears/ Nose/ Throat:   Patient denies sore throat and sinus problems.  Hematologic/Lymphatic:   Patient denies swollen glands and easy bruising.  Cardiovascular:   Patient denies leg swelling and chest pains.  Respiratory:   Patient denies cough and shortness of breath.  Endocrine:   Patient denies excessive thirst.  Musculoskeletal:   Patient denies back pain and joint pain.  Neurological:   Patient denies headaches and dizziness.  Psychologic:   Patient denies depression and anxiety.   VITAL SIGNS:    Weight 200 lb / 90.72 kg  Height 73 in / 185.42 cm  BP 118/73 mmHg  Pulse 61 /min  Temperature 98.0 F / 36.6 C  BMI 26.4 kg/m   MULTI-SYSTEM PHYSICAL EXAMINATION:    Constitutional: Well-nourished. No physical deformities. Normally developed. Good grooming.  Respiratory: No labored breathing, no use of accessory muscles.   Cardiovascular: Normal temperature, adequate peripheral perfusion  Skin: No paleness, no jaundice,  Neurologic / Psychiatric: Oriented to time, oriented to place, oriented to person. No depression, no anxiety, no agitation.  Gastrointestinal: No mass, no tenderness, no rigidity, non obese abdomen. No CVA tenderness  Eyes: Normal conjunctivae. Normal eyelids.  Musculoskeletal: Normal gait and station of head and neck.     PAST DATA REVIEWED:  Source Of History:  Patient  X-Ray Review: C.T. Abdomen/Pelvis: Reviewed Films. Reviewed Report. Discussed With Patient.  PROCEDURES:         C.T. Urogram - [id number]  There are several punctate left renal calculi. The previous right lower pole renal calculus has increased in size to about 1.1 cm. There are no ureteral or bladder calculi.               KUB - [id number]  A single view of the abdomen is obtained.  KUB Study: Abdominal Anterior-Posterior Radiograph  Date of Service: 01/16/2017  Indication: Right renal  calculus  Bony Structures: Visualized bony structures of the ribs, spine, and pelvis demonstrate no obvious large lytic or blastic lesions.  Bowel Gas: Visualized bowel gas is non-obstructive appearing.   Rt Kidney: No radiodense structures are identified in the expected location of the ureter. There is a radiopaque calcification overlying the area of the right kidney corresponding to the right renal calculus seen on CT. Marland Kitchen Lt Kidney: No radiodense structures are identified in the expected location of the ureter or kidney  Bladder Area / Pelvis: No radiodense structures are identified in the expected location of the urinary bladder.   Multiple bilateral densities consistent with phleboliths are visualized.   IMPRESSION: 1. Possibly 1 cm right renal calculus.               Urinalysis Dipstick Dipstick Cont'd  Color: Yellow Bilirubin: Neg  Appearance: Clear Ketones: Neg  Specific Gravity: 1.020 Blood: Neg  pH: 6.5 Protein: Neg  Glucose: Neg Urobilinogen: 0.2    Nitrites: Neg    Leukocyte Esterase: Neg    ASSESSMENT/PLAN:        We discussed the management of urinary stones. These options include observation, ureteroscopy, shockwave lithotripsy, and PCNL. We discussed which options are relevant to these particular stones. We discussed the natural history of stones as well as the complications of untreated stones and the impact on quality of life without treatment as well as with each of the above listed treatments. We also discussed the efficacy of each treatment in its ability to clear the stone burden. With any of these management options I discussed the signs and symptoms of infection and the need for emergent treatment should these be experienced. For each option we discussed the ability of each procedure to clear the patient of their stone burden.   For observation I described the risks which include but are not limited to silent renal damage, life-threatening infection, need for  emergent surgery, failure to pass stone, and pain.   For ureteroscopy I described the risks which include heart attack, stroke, pulmonary embolus, death, bleeding, infection, damage to contiguous structures, positioning injury, ureteral stricture, ureteral avulsion, ureteral injury, need for ureteral stent, inability to perform ureteroscopy, need for an interval procedure, inability to clear stone burden, stent discomfort and pain.   For shockwave lithotripsy I described the risks which include arrhythmia, kidney contusion, kidney hemorrhage, need for transfusion, pain, inability to break up stone, inability to pass stone fragments, Steinstrasse, infection associated with obstructing stones, need for different surgical procedure, need for repeat shockwave lithotripsy, and death.   For PCNL I described the risks including heart attack, pulmonary embolus, death, positioning injury, pneumothorax, hydrothorax, need for chest tube, inability to clear stone burden, renal laceration, arterial venous fistula or malformation, need for embolization of kidney, loss of kidney or renal function, need for repeat procedure, need for prolonged nephrostomy tube, ureteral avulsion.   He would like to proceed with ESWL.

## 2017-01-30 NOTE — Op Note (Signed)
See [facility] OP note scanned into chart. Also because of the size, density, location and other factors that cannot be anticipated I feel this will likely be a staged procedure. This fact supersedes any indication in the scanned [facility] operative note to the contrary.  

## 2017-04-30 IMAGING — CT CT RENAL STONE PROTOCOL
2 of 4 series · 17 of 46 positions shown, 19 images · non-contrast
Comparison: None.

CLINICAL DATA: Acute right flank pain.

EXAM:
CT ABDOMEN AND PELVIS WITHOUT CONTRAST
TECHNIQUE: Multidetector CT imaging of the abdomen and pelvis was performed
following the standard protocol without IV contrast.

[Series 2: stone study 5.0 i30f 1 · axial · 0.94mm/px · z∈[-496,-51]mm · 14 of 99 slices shown, 16 images]
[im 5/99  soft-tissue]
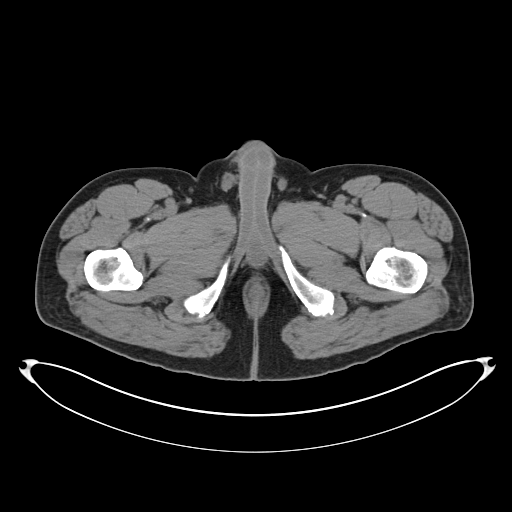
[im 5/99  bone]
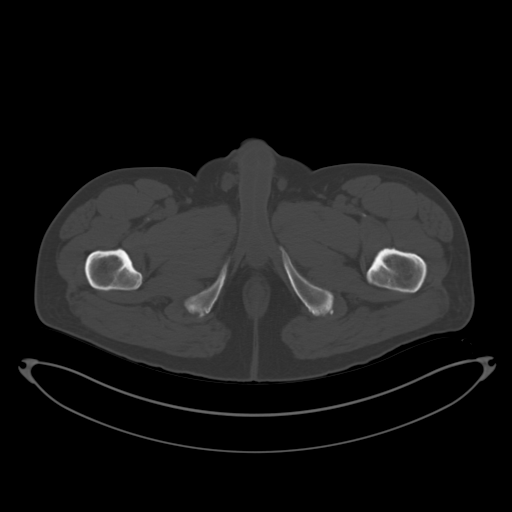
[im 14/99  soft-tissue]
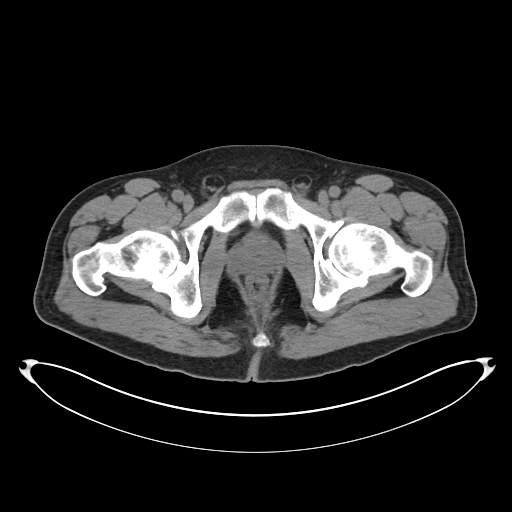
[im 18/99  soft-tissue]
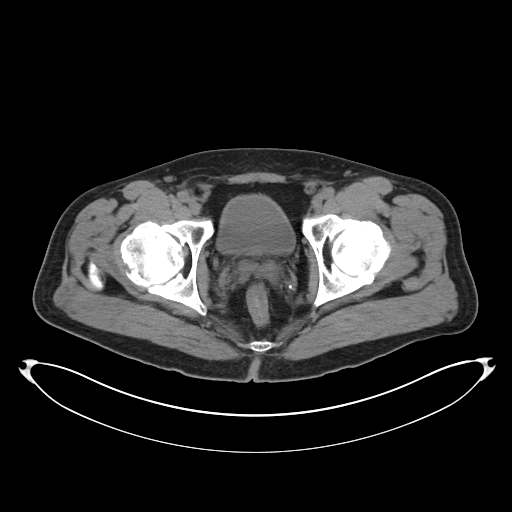
[im 27/99  soft-tissue]
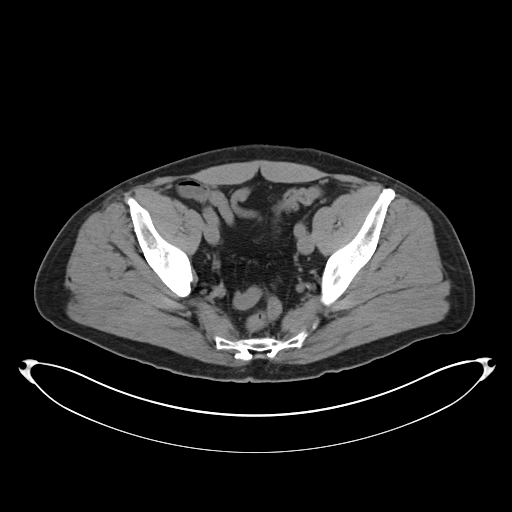
[im 32/99  soft-tissue]
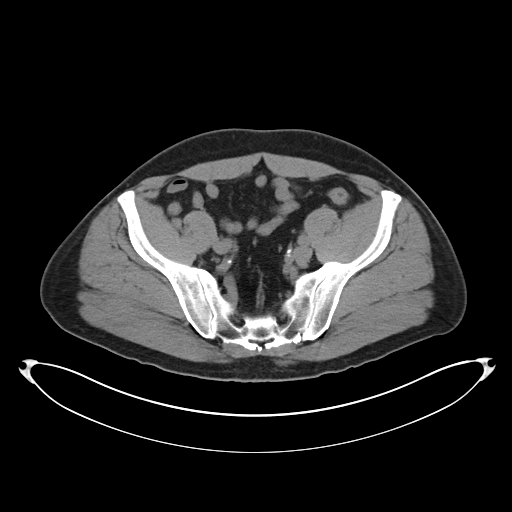
[im 41/99  soft-tissue]
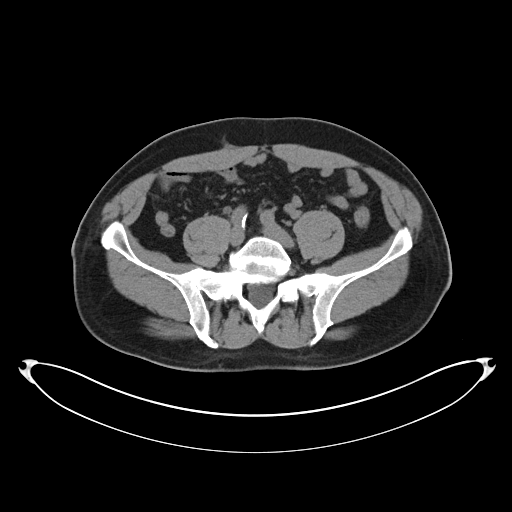
[im 45/99  soft-tissue]
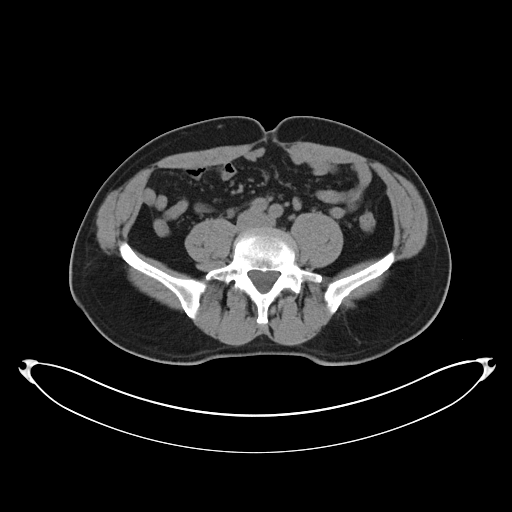
[im 54/99  soft-tissue]
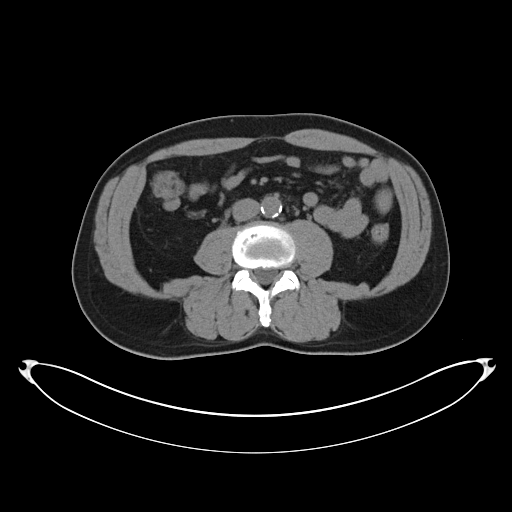
[im 58/99  soft-tissue]
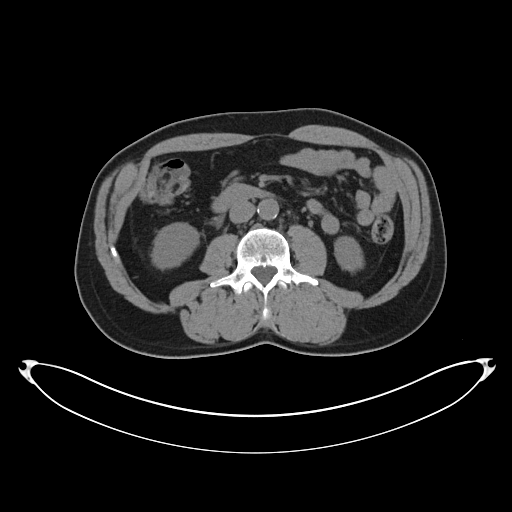
[im 58/99  bone]
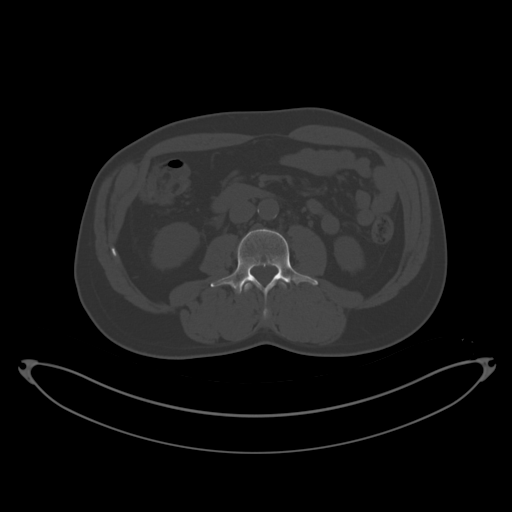
[im 67/99  soft-tissue]
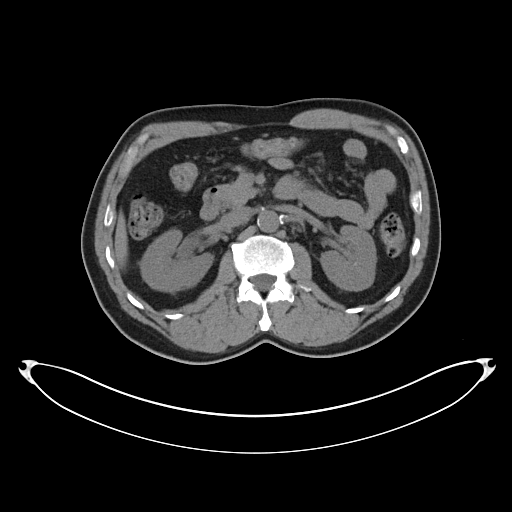
[im 72/99  soft-tissue]
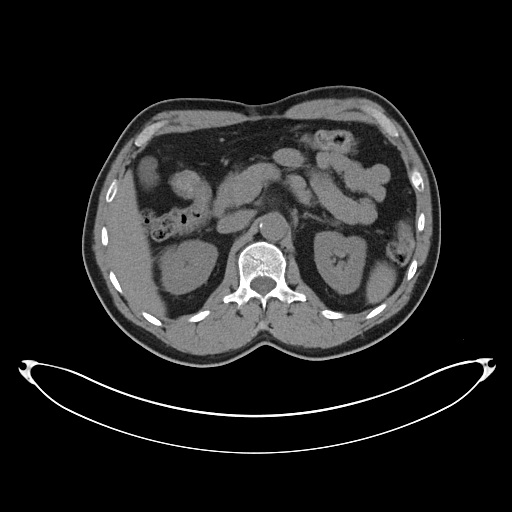
[im 81/99  soft-tissue]
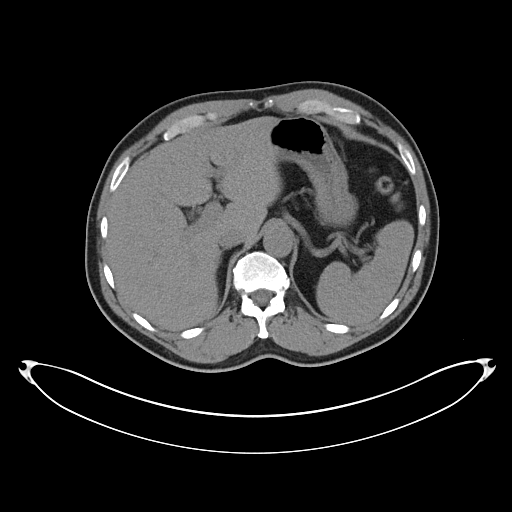
[im 85/99  soft-tissue]
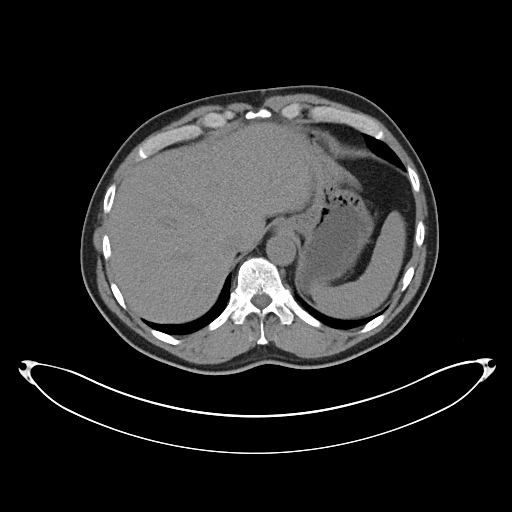
[im 94/99  soft-tissue]
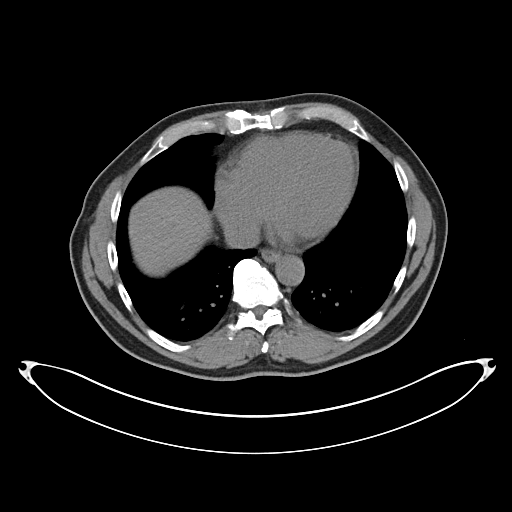

[Series 5: coronal soft tissue · coronal · 0.86mm/px · 3 of 93 slices shown]
[im 31/93  soft-tissue]
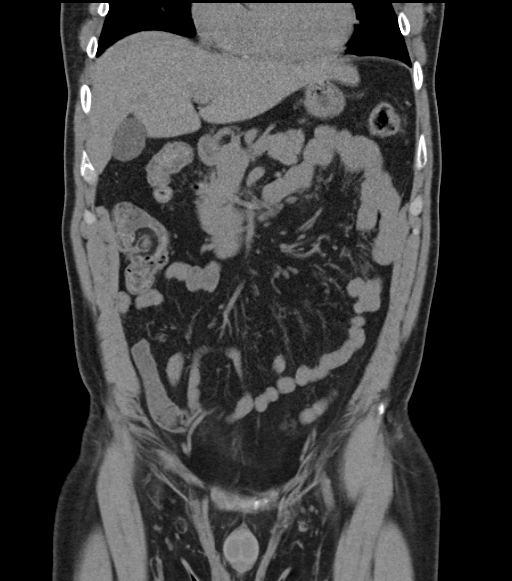
[im 41/93  soft-tissue]
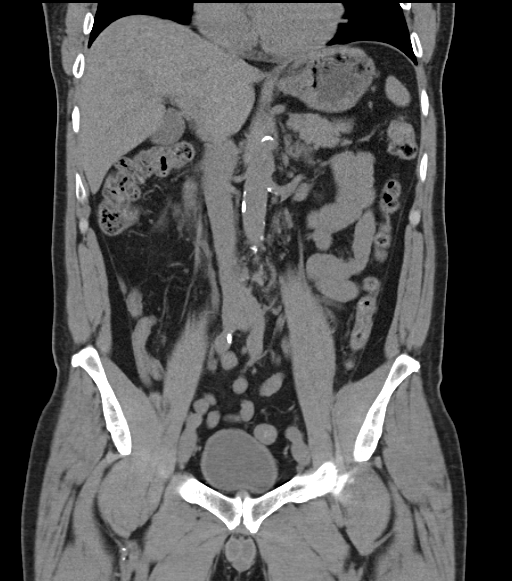
[im 52/93  soft-tissue]
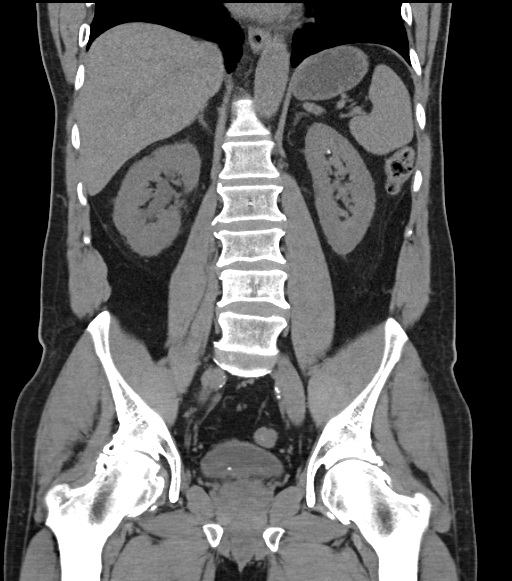

[17 of 46 positions shown; findings below may reference images not displayed]

FINDINGS: Visualized lung bases are unremarkable. Moderate bilateral
degenerative joint disease of both hips is noted.

No gallstones are noted. No focal abnormality is noted in the liver,
spleen or pancreas on these unenhanced images. Adrenal glands appear
normal. Bilateral nephrolithiasis is noted. Mild right
hydroureteronephrosis is noted secondary to 3 mm calculus at right
ureterovesical junction. The appendix appears normal. There is no
evidence of bowel obstruction. No abnormal fluid collection is
noted. Atherosclerosis of abdominal aorta is noted without aneurysm
formation. No significant adenopathy is noted. Urinary bladder and
prostate gland are unremarkable.
IMPRESSION: Bilateral nephrolithiasis. Mild right hydroureteronephrosis
secondary to 3 mm calculus at right ureterovesical junction.

## 2018-09-23 ENCOUNTER — Telehealth: Payer: Self-pay

## 2018-09-23 NOTE — Telephone Encounter (Signed)
Called patient regarding scheduling virtual appointment due to restrictions enacted for Covid 19 but numbers on file are invalid.

## 2018-09-24 ENCOUNTER — Telehealth: Payer: Self-pay | Admitting: Cardiovascular Disease

## 2018-09-24 MED ORDER — NITROGLYCERIN 0.4 MG SL SUBL: 0.4000 mg | SUBLINGUAL_TABLET | SUBLINGUAL | 6 refills | Status: DC | PRN

## 2018-09-24 MED ORDER — ISOSORBIDE MONONITRATE ER 30 MG PO TB24: 30.0000 mg | ORAL_TABLET | Freq: Every day | ORAL | 1 refills | Status: DC

## 2018-09-24 NOTE — Telephone Encounter (Signed)
New message   Pt c/o of Chest Pain: STAT if CP now or developed within 24 hours  1. Are you having CP right now? No   2. Are you experiencing any other symptoms (ex. SOB, nausea, vomiting, sweating)?chest pain and pressure, bp elevated on yesterday   3. How long have you been experiencing CP? Patient states that lasted about an hour on yesterday after exercise   4. Is your CP continuous or coming and going? continuous chest pressure on yesterday  5. Have you taken Nitroglycerin? Patient does not have nitroglycerin  ?

## 2018-09-24 NOTE — Telephone Encounter (Signed)
We will come talk to him tomorrow while he is in office about options for cholesterol management.

## 2018-09-24 NOTE — Telephone Encounter (Signed)
    COVID-19 Pre-Screening Questions:  . In the past 7 to 10 days have you had a cough,  shortness of breath, headache, congestion, fever (100 or greater) body aches, chills, sore throat, or sudden loss of taste or sense of smell?-NO . Have you been around anyone with known Covid 19.-NO . Have you been around anyone who is awaiting Covid 19 test results in the past 7 to 10 days?-NO . Have you been around anyone who has been exposed to Covid 19, or has mentioned symptoms of Covid 19 within the past 7 to 10 days?-NO

## 2018-09-24 NOTE — Telephone Encounter (Signed)
Spoke with the pt and went over recommendations per Dr Acie Fredrickson, for pts worrisome angina. Called in [place] and pt education provided to him on how to use this med. Sent this to his confirmed pharmacy of choice.  Also called in imdur 30 mg po daily to his confirmed pharmacy, and he is aware that this is a daily scheduled med.  Scheduled the pt to see Ellen Henri PA-C for tomorrow as a regular OV at 0815.  Covid pre-screening questions were performed.  Pt aware of visitor restrictions as well. Informed the pt that at his visit he will get an EKG done, labs (suggested troponin level per Dr Acie Fredrickson). Endorsed to the pt that our Pharmacist from lipid clinic will also come down to speak with him while he is in the office, to discuss further options for lipid lowering/cholesterol management, possible discussion to be a candidate for PCSK9-Inhibitors.  Pt verbalized understanding and agrees with this plan. Pt more than gracious for all the assistance provided.  Will send this message to [place] PA-C and her covering RN, as a general FYI about this pt, and Dr Elmarie Shiley plan, as outlined in this note.

## 2018-09-24 NOTE — Telephone Encounter (Signed)
I have discussed the CP episode by phone  Sounds worrisome for angina.    Ivy, please  send in script for NTG 0.4 mg SL and Imdur 30 mg PO a day  ( [facility] )  Please put him on [employer] schedule for tomorrow and call patient to inform him of the time   He will need an ecg. Would also get a troponin level.    I will be available to discuss with Brittainy tomorrow to help decide on the next step    He will need to go back to the lipid clinic to discuss further options for lipid lowering. He has failed all statins and 1 of the PCSK-9 inhibitors.   Could try Bempedoic acid.

## 2018-09-24 NOTE — Telephone Encounter (Signed)
Spoke with patient who states he had an episode of chest tightness for 1 hour last night after his work out. States work out was more intense than usual with running and calisthenics afterward.  Admits to additional pain in the area of the bottom of the rib cage/top of stomach last night and again this morning; complains of what felt like "indigestion" this morning and took rolaids with relief. States he ate lunch today without problem.  Reports BP this morning was 148/89, does not remember HR but states it was "normal" which for him is 50-60 bpm.  Pulse was 48 bpm last night approximately 30 min to 1 hour after his work out. After workout BP initially was 160/89 mmHg then 142/80 mmHg.  He does not have NTG at home so I told him we will be sending him a Rx for that and asked him to pick that up from pharmacy today. I advised that I am sending message to Dr. Acie Fredrickson for advice and that we will call him back later today. He verbalized understanding and agreement and thanked me for the call.

## 2018-09-25 ENCOUNTER — Encounter: Payer: Self-pay | Admitting: *Deleted

## 2018-09-25 ENCOUNTER — Telehealth: Payer: Self-pay | Admitting: *Deleted

## 2018-09-25 ENCOUNTER — Ambulatory Visit: Payer: Commercial Managed Care - PPO | Admitting: Cardiology

## 2018-09-25 ENCOUNTER — Encounter: Payer: Self-pay | Admitting: Cardiology

## 2018-09-25 ENCOUNTER — Telehealth: Payer: Self-pay | Admitting: Pharmacist

## 2018-09-25 ENCOUNTER — Other Ambulatory Visit: Payer: Self-pay

## 2018-09-25 VITALS — BP 132/78 | HR 58 | Ht 73.0 in | Wt 212.8 lb

## 2018-09-25 DIAGNOSIS — E782 Mixed hyperlipidemia: Secondary | ICD-10-CM

## 2018-09-25 DIAGNOSIS — R079 Chest pain, unspecified: Secondary | ICD-10-CM

## 2018-09-25 LAB — TROPONIN T: Troponin T TROPT: 0.479 ng/mL (ref <0.011)

## 2018-09-25 LAB — LIPID PANEL
Chol/HDL Ratio: 6.1 ratio — ABNORMAL HIGH (ref 0.0–5.0)
Cholesterol, Total: 257 mg/dL — ABNORMAL HIGH (ref 100–199)
HDL: 42 mg/dL (ref >39)
LDL Calculated: 165 mg/dL — ABNORMAL HIGH (ref 0–99)
Triglycerides: 252 mg/dL — ABNORMAL HIGH (ref 0–149)
VLDL Cholesterol Cal: 50 mg/dL — ABNORMAL HIGH (ref 5–40)

## 2018-09-25 LAB — HEPATIC FUNCTION PANEL
ALT: 16 IU/L (ref 0–44)
AST: 27 IU/L (ref 0–40)
Albumin: 4.6 g/dL (ref 3.8–4.8)
Alkaline Phosphatase: 79 IU/L (ref 39–117)
Bilirubin Total: 0.7 mg/dL (ref 0.0–1.2)
Bilirubin, Direct: 0.16 mg/dL (ref 0.00–0.40)
Total Protein: 6.8 g/dL (ref 6.0–8.5)

## 2018-09-25 NOTE — Telephone Encounter (Addendum)
Patient ID: Kenneth Vance                 DOB: [birth year]                    MRN: [id number]     HPI: Kenneth Vance is a 63 y.o. male patient referred to lipid clinic by Dr. Acie Fredrickson. PMH is significant for CAD s/p CABG in 2016 and hyperlipidemia.   Patient has tried several different statins and Zetia in the past and has experienced myalgias on all medications. Patient has also tried Repatha in 2018 but reported muscle and joint aches with use. Patient states that he has not been on any cholesterol medications since trying Repatha but reports he has tried to make some dietary changes to help his cholesterol panel since then.   Current Medications: Zetia 10mg  daily - stopped taking about a week ago d/t myalgias Intolerances: Crestor 10mg  every other day, simvastatin, atorvastatin, pravastatin, Livalo, fenofibrate, Zetia 10mg  daily, Repatha - myalgias with all that resolved upon discontinuation. Risk Factors: CAD s/p CABG 03/2015 LDL goal: < 70mg /dL  Diet: Reports he has tried to make positive diet changes and stays away from red meats as much as possible.  Breakfast: fruits, sunflower butter, whole what toast, french toast once a week Lunch: lean grilled chicken or roast beef, occasionally fast food  Dinner: Home cooked the last couple months, lots of grilled salmon, grilled chicken, steak, roast beef, pork chops occasionally, veggies, salads    Exercise: Walking 3-5 miles 3-4 times a week. Reports going on a run a couple days ago but experienced chest pain.   Family History: Hyperlipidemia in his mother, pulmonary fibrosis and CABG x7 in his father. 4 uncles on his mother's side died in their 31s from heart disease.  Social History: The patient  reports that he has never smoked. He does not have any smokeless tobacco history on file. He reports that he drinks alcohol. He reports that he does not use illicit drugs.   Labs: 05/14/2017: TC 275, TG 183, TC 275, HDL 38, LDL 200 (no therapy)  10/04/15:  TC 202, TG 157, HDL 45, LDL 126 (on Zetia 10mg  daily - had d/c'ed a few days prior to bloodwork being drawn) 05/11/15: TC 258, TG 195, HDL 46, LDL 172 (no therapy)   Past Medical History:  Diagnosis Date  . BCC (basal cell carcinoma), ear   . CAD in native artery 03/29/15   severe CAD   . History of kidney stones   . Hyperlipidemia   . Pneumonia    as infant  . S/P CABG x 4 03/31/15    Current Outpatient Medications on File Prior to Visit  Medication Sig Dispense Refill  . aspirin 81 MG EC tablet Take 1 tablet (81 mg total) by mouth daily.    . isosorbide mononitrate (IMDUR) 30 MG 24 hr tablet Take 1 tablet (30 mg total) by mouth daily. 90 tablet 1  . nitroGLYCERIN (NITROSTAT) 0.4 MG SL tablet Place 1 tablet (0.4 mg total) under the tongue every 5 (five) minutes as needed for chest pain. 25 tablet 6   No current facility-administered medications on file prior to visit.     Allergies  Allergen Reactions  . Peanut-Containing Drug Products   . Crestor [Rosuvastatin Calcium] Other (See Comments)    Myalgias  . Repatha [Evolocumab] Other (See Comments)    Muscle pain   . Zetia [Ezetimibe] Other (See Comments)    Myalgias  .  Fenofibrate   . Lipitor [Atorvastatin] Other (See Comments)    myalgia  . Livalo [Pitavastatin] Other (See Comments)    myalgia  . Niacin Other (See Comments)  . Niacin And Related Other (See Comments)    Flushing, feels weak   . Peanut Oil Other (See Comments)  . Zocor [Simvastatin] Other (See Comments)    myalgia    Assessment/Plan:  1. Hyperlipidemia - Lipid panel last checked in 04/2017 reports LDL still above goal at 200mg /dL (goal 70mg /dL with history of CAD and CABG). Patient is intolerant to statins, Zetia, and Repatha. Patient is willing to try Praluent 75 mg twice weekly injectable at this time. Patient reports he is familiar with use as he was on Repatha injections previously. Instructed patient to call the clinic with any problems he notices  after starting the medication. Patient was provided with a co-pay card in clinic with activation instructions and was informed that we will give him a call once the prior authorization has been approved by his insurance. Follow-up fasting lipid panels have been scheduled for 11/30/2018.   Gwenlyn Found, [place] D PGY1 Pharmacy Resident  09/25/2018   9:43 AM

## 2018-09-25 NOTE — H&P (View-Only) (Signed)
09/25/2018 Benjaman Lobe   06/15/1955  [id number]  Primary Physician Gaynelle Arabian, MD Primary Cardiologist: Mertie Moores, MD  Electrophysiologist: None   Reason for Visit/CC: Chest Pain   HPI:  Kenneth Vance is a 63 y.o. male, followed by Dr. Acie Fredrickson, who is being seen today for the evaluation of chest pain. He has known CAD s/p CABG x 4 03/2015 and HLD w/ statin intolerance. CABG preformed by Dr. Cyndia Bent. LIMA-LAD, SVG-Ramus, SVG-OM and SVG-PDA. Echo at time of revascularization showed normal LVEF, 60-65%, and no significant valvular disease.  He has required no repeat ischemic evaluation since undergoing his bypass. He was lost to f/u. Not seen since 08/2015. Added on to my schedule for today given new CP.   Mr. Mariea Clonts reports that he had been doing great since his bypass surgery without any symptoms until developing chest pain 2 days ago.  He is physically active.  He walks on the treadmill regularly for exercise, however 2 days ago he decided to increase the intensity.  Instead of walking he decided to jog on the treadmill.  He bumped his pace up to 8 mph and jogged for 20 minutes.  However he was able to do this without any symptoms.  He had no exertional chest pain or dyspnea during his jog.  It was not until after he had completed his workout and during recovery when he developed chest pain.  Patient reports symptoms started approximately 10 minutes after his workout.  Pain was substernal and felt like pressure. 4/10 in intensity. Nonradiating.  No associated dyspnea or palpitations.  He checked his blood pressure and noted that his blood pressure was markedly higher than his usual baseline.  His blood pressure was in the 924Q systolic.  His baseline is typically in the 683M to 196Q systolic.  Reports that his pulse rate was also low in the upper 40s.  His baseline is typically in the 50s.  The pain was different from his angina in 2016.  His angina in 2016 was exertional chest pain and dyspnea, and  decreased exercise tolerance.  During his chest pain episode the other day, he walked around his house without any worsening symptoms.  He took 2 full dose aspirin without any immediate relief.  Chest pain episode lasted for roughly 1 hour before spontaneously resolving.  The following day, which was yesterday, he felt a "knot" in his mid epigastric area which he reports felt like indigestion.  Symptoms not worsened by physical activity.  He reports that his discomfort went away after eating lunch which consisted of a grilled chicken sandwich and lemonade.  He denies any recurrence.  Currently chest pain-free.  EKG today shows sinus bradycardia, 58 bpm, with first-degree AV block right bundle branch block and inferior Q waves, also seen on prior EKGs.  BP today is controlled at 132/78.  Physical exam is benign.  Current medications include daily aspirin 81 mg.  Imdur was called into his pharmacy yesterday 30 mg.  He has picked this up from pharmacy but has not yet started.  Was planning on starting today.  Our lipid clinic plans to discuss with him today alternatives to statin therapy given statin intolerance.   Cardiac Studies   [facility]HC 03/29/2015 Procedures   Left Heart Cath and Coronary Angiography  Conclusion    Ost RPDA to RPDA lesion, 90% stenosed. High bifurcation of PDA and PLA. PLA is free of severe disease.  Ost Ramus lesion, 90% stenosed.  Ost 1st Mrg to  1st Mrg lesion, 90% stenosed.  Ost LAD to Prox LAD lesion, 90% stenosed.  The left ventricular systolic function is normal.   Severe three vessel disease which would be best treated with CABG.  Will admit the patient.  He will need consideration for PCSK9 inhibitor in the future due to his statin intolerance.    TEE 03/31/2015 Study Conclusions  - Left ventricle: Systolic function was normal. The estimated   ejection fraction was in the range of 60% to 65%. - Aortic valve: No evidence of vegetation. - Mitral valve: No evidence of  vegetation. - Right atrium: No evidence of thrombus in the atrial cavity or   appendage. - Atrial septum: No defect or patent foramen ovale was identified.   Echo contrast study showed no right-to-left atrial level shunt,   following an increase in RA pressure induced by provocative   maneuvers.  Impressions:  - S/P multivessel CABG     -no inotropic agents needed for weaning     EF at baseline   RV baseline and normal   AV and MV at baseline   No sign of aortic dissection   No sign of pericardial effusion   No sign of ASD or PFO.  Current Meds  Medication Sig   aspirin 81 MG EC tablet Take 1 tablet (81 mg total) by mouth daily.   isosorbide mononitrate (IMDUR) 30 MG 24 hr tablet Take 1 tablet (30 mg total) by mouth daily.   nitroGLYCERIN (NITROSTAT) 0.4 MG SL tablet Place 1 tablet (0.4 mg total) under the tongue every 5 (five) minutes as needed for chest pain.   Allergies  Allergen Reactions   Peanut-Containing Drug Products    Crestor [Rosuvastatin Calcium] Other (See Comments)    Myalgias   Repatha [Evolocumab] Other (See Comments)    Muscle pain    Zetia [Ezetimibe] Other (See Comments)    Myalgias   Fenofibrate    Lipitor [Atorvastatin] Other (See Comments)    myalgia   Livalo [Pitavastatin] Other (See Comments)    myalgia   Niacin Other (See Comments)   Niacin And Related Other (See Comments)    Flushing, feels weak    Peanut Oil Other (See Comments)   Zocor [Simvastatin] Other (See Comments)    myalgia   Past Medical History:  Diagnosis Date   BCC (basal cell carcinoma), ear    CAD in native artery 03/29/15   severe CAD    History of kidney stones    Hyperlipidemia    Pneumonia    as infant   S/P CABG x 4 03/31/15   Family History  Problem Relation Age of Onset   Hyperlipidemia Mother    Pulmonary fibrosis Father    Past Surgical History:  Procedure Laterality Date   CARDIAC CATHETERIZATION N/A 03/29/2015   Procedure:  Left Heart Cath and Coronary Angiography;  Surgeon: Jettie Booze, MD;  Location: [facility];  Service: Cardiovascular;  Laterality: N/A;   CATARACT EXTRACTION Left    CORONARY ARTERY BYPASS GRAFT N/A 03/31/2015   Procedure: CORONARY ARTERY BYPASS GRAFTING (CABG) X 4 UTILIZING THE LEFT INTERNAL MAMMARY ARTERY  AND RIGHT SAPHENEOUS VEINS .;  Surgeon: Gaye Pollack, MD;  Location: [facility];  Service: Open Heart Surgery;  Laterality: N/A;   EXTRACORPOREAL SHOCK WAVE LITHOTRIPSY Right 01/30/2017   Procedure: RIGHT EXTRACORPOREAL SHOCK WAVE LITHOTRIPSY (ESWL);  Surgeon: Kathie Rhodes, MD;  Location: [facility];  Service: Urology;  Laterality: Right;   HYDROCELE EXCISION  MANDIBLE SURGERY     TEE WITHOUT CARDIOVERSION N/A 03/31/2015   Procedure: TRANSESOPHAGEAL ECHOCARDIOGRAM (TEE);  Surgeon: Gaye Pollack, MD;  Location: [facility];  Service: Open Heart Surgery;  Laterality: N/A;   Social History   Socioeconomic History   Marital status: Married    Spouse name: Not on file   Number of children: Not on file   Years of education: Not on file   Highest education level: Not on file  Occupational History   Not on file  Social Needs   Financial resource strain: Not on file   Food insecurity:    Worry: Not on file    Inability: Not on file   Transportation needs:    Medical: Not on file    Non-medical: Not on file  Tobacco Use   Smoking status: Never Smoker   Smokeless tobacco: Never Used  Substance and Sexual Activity   Alcohol use: Yes    Alcohol/week: 0.0 standard drinks    Comment: daily - 1-2 glasses of wine    Drug use: No   Sexual activity: Not on file  Lifestyle   Physical activity:    Days per week: Not on file    Minutes per session: Not on file   Stress: Not on file  Relationships   Social connections:    Talks on phone: Not on file    Gets together: Not on file    Attends religious service: Not on file    Active member of club or organization:  Not on file    Attends meetings of clubs or organizations: Not on file    Relationship status: Not on file   Intimate partner violence:    Fear of current or ex partner: Not on file    Emotionally abused: Not on file    Physically abused: Not on file    Forced sexual activity: Not on file  Other Topics Concern   Not on file  Social History Narrative   Not on file     Lipid Panel     Component Value Date/Time   CHOL 153 08/29/2016 0925   TRIG 263 (H) 08/29/2016 0925   HDL 44 08/29/2016 0925   CHOLHDL 3.5 08/29/2016 0925   CHOLHDL 4.5 10/04/2015 0812   VLDL 31 (H) 10/04/2015 0812   LDLCALC 56 08/29/2016 0925    Review of Systems: General: negative for chills, fever, night sweats or weight changes.  Cardiovascular: negative for chest pain, dyspnea on exertion, edema, orthopnea, palpitations, paroxysmal nocturnal dyspnea or shortness of breath Dermatological: negative for rash Respiratory: negative for cough or wheezing Urologic: negative for hematuria Abdominal: negative for nausea, vomiting, diarrhea, bright red blood per rectum, melena, or hematemesis Neurologic: negative for visual changes, syncope, or dizziness All other systems reviewed and are otherwise negative except as noted above.   Physical Exam:  Blood pressure 132/78, pulse (!) 58, height 6\' 1"  (1.854 m), weight 212 lb 12.8 oz (96.5 kg).  General appearance: alert, cooperative and no distress Neck: no carotid bruit and no JVD Lungs: clear to auscultation bilaterally Heart: regular rate and rhythm, S1, S2 normal, no murmur, click, rub or gallop Extremities: extremities normal, atraumatic, no cyanosis or edema Pulses: 2+ and symmetric Skin: Skin color, texture, turgor normal. No rashes or lesions Neurologic: Grossly normal  EKG sinus bradycardia 58 bpm 1st degree AV block w/ PACs, inferior Qwaves present on old EKGs -- personally reviewed   ASSESSMENT AND PLAN:   1. Chest Pain: Recent chest  pain with  mostly atypical features.  Pain started roughly 10 minutes after a physical workout.  No symptoms during the activity itself.  Pain was not worsened by exertion and there was no associated dyspnea.  Symptoms persisted roughly 1 hour before spontaneously resolving.  He is currently chest pain-free.  Physical exam is benign.  EKG shows sinus bradycardia, 58 bpm, and no ST changes in comparison to prior EKGs.  It was advised yesterday, by his primary cardiologist when appointment was made, to check a troponin level.  We will do so.  It was also advised that he start Imdur 30 mg daily.  At this point, I favor checking a troponin level and if negative continuing with plans to treat medically with Imdur.  His heart rate will not allow addition of a beta-blocker.  We will arrange a telehealth visit with Dr. Acie Fredrickson in 1 to 2 weeks for follow-up.   2. CAD: Status post CABG x4 in December 2016, by Dr. Mohammed Kindle, with LIMA to the LAD, SVG-Ramus, SVG-OM and SVG-PDA.  Recent chest pain, as described above not consistent with previous angina.  For now we will plan to continue medical therapy with aspirin.  He is statin intolerant.  Lipid clinic to discuss alternatives including PCSK9 inhibitor therapy.  Heart rate would not allow use of beta-blocker.  He will try low-dose Imdur as outlined above.  3. HLD w/ Statin Intolerance: Will update lipid panel.  LDL goal in the setting of known CAD with history of CABG is less than 70 mg/dL.  Our pharmacist in the lipid clinic to meet with him today to discuss PCSK9 inhibitors.   ADDENDUM: Troponin level returned abnormal at 0.479. I have notified Dr. Acie Fredrickson. He has recommended definitive cardiac catheterization. I have notified pt of result and Dr. Elmarie Shiley recommendations. Pt is in agreement to proceed with outpatient cath. I have reviewed the risks, indications, and alternatives to cardiac catheterization and possible angioplasty/stenting with the patient. Risks include but are not  limited to bleeding, infection, vascular injury, stroke, myocardial infection, arrhythmia, kidney injury, radiation-related injury in the case of prolonged fluoroscopy use, emergency cardiac surgery, and death. The patient understands the risks of serious complication is low (<9%). Pt also advised to not exercise until cath. He verbalized understanding. He understands to go to the ED if he has any recurrent CP. I also confirmed that the patient has SL NTG at home. I disused proper use of SL NTG and when to seek emergency medical attention. We will arrange outpatient cath, pre procedural labs and COVID-19 screening.     Kanya Potteiger Ladoris Gene, MHS [facility] 09/25/2018 8:27 AM

## 2018-09-25 NOTE — Patient Instructions (Addendum)
Medication Instructions:  Start IMDUR as instructed  If you need a refill on your cardiac medications before your next appointment, please call your pharmacy.   Lab work: Today: lipids, liver function, troponin  If you have labs (blood work) drawn today and your tests are completely normal, you will receive your results only by: Marland Kitchen MyChart Message (if you have MyChart) OR . A paper copy in the mail If you have any lab test that is abnormal or we need to change your treatment, we will call you to review the results.  Testing/Procedures: none  Follow-Up: At [facility], you and your health needs are our priority.  As part of our continuing mission to provide you with exceptional heart care, we have created designated Provider Care Teams.  These Care Teams include your primary Cardiologist (physician) and Advanced Practice Providers (APPs -  Physician Assistants and Nurse Practitioners) who all work together to provide you with the care you need, when you need it. You will need a follow up appointment in:  1-2 weeks.  Please call our office 2 months in advance to schedule this appointment.  You may see Mertie Moores, MD or one of the following Advanced Practice Providers on your designated Care Team: Richardson Dopp, PA-C [place], [place] . Daune Perch, NP  Any Other Special Instructions Will Be Listed Below (If Applicable).

## 2018-09-25 NOTE — Progress Notes (Addendum)
09/25/2018 Benjaman Lobe   01-16-1956  [id number]  Primary Physician Gaynelle Arabian, MD Primary Cardiologist: Mertie Moores, MD  Electrophysiologist: None   Reason for Visit/CC: Chest Pain   HPI:  Kenneth Vance is a 63 y.o. male, followed by Dr. Acie Fredrickson, who is being seen today for the evaluation of chest pain. He has known CAD s/p CABG x 4 03/2015 and HLD w/ statin intolerance. CABG preformed by Dr. Cyndia Bent. LIMA-LAD, SVG-Ramus, SVG-OM and SVG-PDA. Echo at time of revascularization showed normal LVEF, 60-65%, and no significant valvular disease.  He has required no repeat ischemic evaluation since undergoing his bypass. He was lost to f/u. Not seen since 08/2015. Added on to my schedule for today given new CP.   Mr. Mariea Clonts reports that he had been doing great since his bypass surgery without any symptoms until developing chest pain 2 days ago.  He is physically active.  He walks on the treadmill regularly for exercise, however 2 days ago he decided to increase the intensity.  Instead of walking he decided to jog on the treadmill.  He bumped his pace up to 8 mph and jogged for 20 minutes.  However he was able to do this without any symptoms.  He had no exertional chest pain or dyspnea during his jog.  It was not until after he had completed his workout and during recovery when he developed chest pain.  Patient reports symptoms started approximately 10 minutes after his workout.  Pain was substernal and felt like pressure. 4/10 in intensity. Nonradiating.  No associated dyspnea or palpitations.  He checked his blood pressure and noted that his blood pressure was markedly higher than his usual baseline.  His blood pressure was in the 361W systolic.  His baseline is typically in the 431V to 400Q systolic.  Reports that his pulse rate was also low in the upper 40s.  His baseline is typically in the 50s.  The pain was different from his angina in 2016.  His angina in 2016 was exertional chest pain and dyspnea, and  decreased exercise tolerance.  During his chest pain episode the other day, he walked around his house without any worsening symptoms.  He took 2 full dose aspirin without any immediate relief.  Chest pain episode lasted for roughly 1 hour before spontaneously resolving.  The following day, which was yesterday, he felt a "knot" in his mid epigastric area which he reports felt like indigestion.  Symptoms not worsened by physical activity.  He reports that his discomfort went away after eating lunch which consisted of a grilled chicken sandwich and lemonade.  He denies any recurrence.  Currently chest pain-free.  EKG today shows sinus bradycardia, 58 bpm, with first-degree AV block right bundle branch block and inferior Q waves, also seen on prior EKGs.  BP today is controlled at 132/78.  Physical exam is benign.  Current medications include daily aspirin 81 mg.  Imdur was called into his pharmacy yesterday 30 mg.  He has picked this up from pharmacy but has not yet started.  Was planning on starting today.  Our lipid clinic plans to discuss with him today alternatives to statin therapy given statin intolerance.   Cardiac Studies   [facility] 03/29/2015 Procedures   Left Heart Cath and Coronary Angiography  Conclusion    Ost RPDA to RPDA lesion, 90% stenosed. High bifurcation of PDA and PLA. PLA is free of severe disease.  Ost Ramus lesion, 90% stenosed.  Ost 1st Mrg to  1st Mrg lesion, 90% stenosed.  Ost LAD to Prox LAD lesion, 90% stenosed.  The left ventricular systolic function is normal.   Severe three vessel disease which would be best treated with CABG.  Will admit the patient.  He will need consideration for PCSK9 inhibitor in the future due to his statin intolerance.    TEE 03/31/2015 Study Conclusions  - Left ventricle: Systolic function was normal. The estimated   ejection fraction was in the range of 60% to 65%. - Aortic valve: No evidence of vegetation. - Mitral valve: No evidence of  vegetation. - Right atrium: No evidence of thrombus in the atrial cavity or   appendage. - Atrial septum: No defect or patent foramen ovale was identified.   Echo contrast study showed no right-to-left atrial level shunt,   following an increase in RA pressure induced by provocative   maneuvers.  Impressions:  - S/P multivessel CABG     -no inotropic agents needed for weaning     EF at baseline   RV baseline and normal   AV and MV at baseline   No sign of aortic dissection   No sign of pericardial effusion   No sign of ASD or PFO.  Current Meds  Medication Sig   aspirin 81 MG EC tablet Take 1 tablet (81 mg total) by mouth daily.   isosorbide mononitrate (IMDUR) 30 MG 24 hr tablet Take 1 tablet (30 mg total) by mouth daily.   nitroGLYCERIN (NITROSTAT) 0.4 MG SL tablet Place 1 tablet (0.4 mg total) under the tongue every 5 (five) minutes as needed for chest pain.   Allergies  Allergen Reactions   Peanut-Containing Drug Products    Crestor [Rosuvastatin Calcium] Other (See Comments)    Myalgias   Repatha [Evolocumab] Other (See Comments)    Muscle pain    Zetia [Ezetimibe] Other (See Comments)    Myalgias   Fenofibrate    Lipitor [Atorvastatin] Other (See Comments)    myalgia   Livalo [Pitavastatin] Other (See Comments)    myalgia   Niacin Other (See Comments)   Niacin And Related Other (See Comments)    Flushing, feels weak    Peanut Oil Other (See Comments)   Zocor [Simvastatin] Other (See Comments)    myalgia   Past Medical History:  Diagnosis Date   BCC (basal cell carcinoma), ear    CAD in native artery 03/29/15   severe CAD    History of kidney stones    Hyperlipidemia    Pneumonia    as infant   S/P CABG x 4 03/31/15   Family History  Problem Relation Age of Onset   Hyperlipidemia Mother    Pulmonary fibrosis Father    Past Surgical History:  Procedure Laterality Date   CARDIAC CATHETERIZATION N/A 03/29/2015   Procedure:  Left Heart Cath and Coronary Angiography;  Surgeon: Jettie Booze, MD;  Location: [facility];  Service: Cardiovascular;  Laterality: N/A;   CATARACT EXTRACTION Left    CORONARY ARTERY BYPASS GRAFT N/A 03/31/2015   Procedure: CORONARY ARTERY BYPASS GRAFTING (CABG) X 4 UTILIZING THE LEFT INTERNAL MAMMARY ARTERY  AND RIGHT SAPHENEOUS VEINS .;  Surgeon: Gaye Pollack, MD;  Location: [facility];  Service: Open Heart Surgery;  Laterality: N/A;   EXTRACORPOREAL SHOCK WAVE LITHOTRIPSY Right 01/30/2017   Procedure: RIGHT EXTRACORPOREAL SHOCK WAVE LITHOTRIPSY (ESWL);  Surgeon: Kathie Rhodes, MD;  Location: [facility];  Service: Urology;  Laterality: Right;   HYDROCELE EXCISION  MANDIBLE SURGERY     TEE WITHOUT CARDIOVERSION N/A 03/31/2015   Procedure: TRANSESOPHAGEAL ECHOCARDIOGRAM (TEE);  Surgeon: Gaye Pollack, MD;  Location: [facility];  Service: Open Heart Surgery;  Laterality: N/A;   Social History   Socioeconomic History   Marital status: Married    Spouse name: Not on file   Number of children: Not on file   Years of education: Not on file   Highest education level: Not on file  Occupational History   Not on file  Social Needs   Financial resource strain: Not on file   Food insecurity:    Worry: Not on file    Inability: Not on file   Transportation needs:    Medical: Not on file    Non-medical: Not on file  Tobacco Use   Smoking status: Never Smoker   Smokeless tobacco: Never Used  Substance and Sexual Activity   Alcohol use: Yes    Alcohol/week: 0.0 standard drinks    Comment: daily - 1-2 glasses of wine    Drug use: No   Sexual activity: Not on file  Lifestyle   Physical activity:    Days per week: Not on file    Minutes per session: Not on file   Stress: Not on file  Relationships   Social connections:    Talks on phone: Not on file    Gets together: Not on file    Attends religious service: Not on file    Active member of club or organization:  Not on file    Attends meetings of clubs or organizations: Not on file    Relationship status: Not on file   Intimate partner violence:    Fear of current or ex partner: Not on file    Emotionally abused: Not on file    Physically abused: Not on file    Forced sexual activity: Not on file  Other Topics Concern   Not on file  Social History Narrative   Not on file     Lipid Panel     Component Value Date/Time   CHOL 153 08/29/2016 0925   TRIG 263 (H) 08/29/2016 0925   HDL 44 08/29/2016 0925   CHOLHDL 3.5 08/29/2016 0925   CHOLHDL 4.5 10/04/2015 0812   VLDL 31 (H) 10/04/2015 0812   LDLCALC 56 08/29/2016 0925    Review of Systems: General: negative for chills, fever, night sweats or weight changes.  Cardiovascular: negative for chest pain, dyspnea on exertion, edema, orthopnea, palpitations, paroxysmal nocturnal dyspnea or shortness of breath Dermatological: negative for rash Respiratory: negative for cough or wheezing Urologic: negative for hematuria Abdominal: negative for nausea, vomiting, diarrhea, bright red blood per rectum, melena, or hematemesis Neurologic: negative for visual changes, syncope, or dizziness All other systems reviewed and are otherwise negative except as noted above.   Physical Exam:  Blood pressure 132/78, pulse (!) 58, height 6\' 1"  (1.854 m), weight 212 lb 12.8 oz (96.5 kg).  General appearance: alert, cooperative and no distress Neck: no carotid bruit and no JVD Lungs: clear to auscultation bilaterally Heart: regular rate and rhythm, S1, S2 normal, no murmur, click, rub or gallop Extremities: extremities normal, atraumatic, no cyanosis or edema Pulses: 2+ and symmetric Skin: Skin color, texture, turgor normal. No rashes or lesions Neurologic: Grossly normal  EKG sinus bradycardia 58 bpm 1st degree AV block w/ PACs, inferior Qwaves present on old EKGs -- personally reviewed   ASSESSMENT AND PLAN:   1. Chest Pain: Recent chest  pain with  mostly atypical features.  Pain started roughly 10 minutes after a physical workout.  No symptoms during the activity itself.  Pain was not worsened by exertion and there was no associated dyspnea.  Symptoms persisted roughly 1 hour before spontaneously resolving.  He is currently chest pain-free.  Physical exam is benign.  EKG shows sinus bradycardia, 58 bpm, and no ST changes in comparison to prior EKGs.  It was advised yesterday, by his primary cardiologist when appointment was made, to check a troponin level.  We will do so.  It was also advised that he start Imdur 30 mg daily.  At this point, I favor checking a troponin level and if negative continuing with plans to treat medically with Imdur.  His heart rate will not allow addition of a beta-blocker.  We will arrange a telehealth visit with Dr. Acie Fredrickson in 1 to 2 weeks for follow-up.   2. CAD: Status post CABG x4 in December 2016, by Dr. Mohammed Kindle, with LIMA to the LAD, SVG-Ramus, SVG-OM and SVG-PDA.  Recent chest pain, as described above not consistent with previous angina.  For now we will plan to continue medical therapy with aspirin.  He is statin intolerant.  Lipid clinic to discuss alternatives including PCSK9 inhibitor therapy.  Heart rate would not allow use of beta-blocker.  He will try low-dose Imdur as outlined above.  3. HLD w/ Statin Intolerance: Will update lipid panel.  LDL goal in the setting of known CAD with history of CABG is less than 70 mg/dL.  Our pharmacist in the lipid clinic to meet with him today to discuss PCSK9 inhibitors.   ADDENDUM: Troponin level returned abnormal at 0.479. I have notified Dr. Acie Fredrickson. He has recommended definitive cardiac catheterization. I have notified pt of result and Dr. Elmarie Shiley recommendations. Pt is in agreement to proceed with outpatient cath. I have reviewed the risks, indications, and alternatives to cardiac catheterization and possible angioplasty/stenting with the patient. Risks include but are not  limited to bleeding, infection, vascular injury, stroke, myocardial infection, arrhythmia, kidney injury, radiation-related injury in the case of prolonged fluoroscopy use, emergency cardiac surgery, and death. The patient understands the risks of serious complication is low (<3%). Pt also advised to not exercise until cath. He verbalized understanding. He understands to go to the ED if he has any recurrent CP. I also confirmed that the patient has SL NTG at home. I disused proper use of SL NTG and when to seek emergency medical attention. We will arrange outpatient cath, pre procedural labs and COVID-19 screening.     Bram Hottel Ladoris Gene, MHS [facility] 09/25/2018 8:27 AM

## 2018-09-25 NOTE — Telephone Encounter (Signed)
Pt seen by APP in clinic today. Had labs.   Now needs left heart cath. Per Brittainy and Dr. Acie Fredrickson, the patient can be scheduled for cath on Tue 09/29/18. He is instructed to keep activity to a minimum until then.  He has ntg and has been instructed to use. He is instructed go to ER if any symptoms return over weekend or Monday.     Pt has been scheduled for left heart cath for Tue 09/29/18 at 1:30 pm with Dr. Irish Lack.  They are aware he will need labs prior .     Cepheid Testing scheduled at [facility] Entr[facility] for 09/28/18 at 3:05 pm. Pt aware. Reviewed all cath instructions with him.  Has no questions.  Requested instructions by email.   Did advise him to sign up for MyChart.

## 2018-09-28 ENCOUNTER — Telehealth: Payer: Self-pay | Admitting: *Deleted

## 2018-09-28 ENCOUNTER — Telehealth: Payer: Self-pay

## 2018-09-28 ENCOUNTER — Other Ambulatory Visit (HOSPITAL_COMMUNITY)
Admission: RE | Admit: 2018-09-28 | Discharge: 2018-09-28 | Disposition: A | Discharge status: Home / Self Care | Payer: Commercial Managed Care - PPO | Source: Ambulatory Visit | Attending: Interventional Cardiology | Admitting: Interventional Cardiology

## 2018-09-28 DIAGNOSIS — Z1159 Encounter for screening for other viral diseases: Secondary | ICD-10-CM | POA: Diagnosis present

## 2018-09-28 LAB — SARS CORONAVIRUS 2 BY RT PCR (HOSPITAL ORDER, PERFORMED IN ~~LOC~~ HOSPITAL LAB): SARS Coronavirus 2: NEGATIVE

## 2018-09-28 NOTE — Telephone Encounter (Signed)
Notes recorded by Frederik Schmidt, RN on 09/28/2018 at 3:29 PM EDT The patient has been notified of the result and verbalized understanding. Waiting to hear from pharmacist on PCSK9i start. All questions (if any) were answered. Frederik Schmidt, RN 09/28/2018 3:28 PM   ------

## 2018-09-28 NOTE — Telephone Encounter (Signed)
-----   Message from Consuelo Pandy, [place] sent at 09/28/2018  2:30 PM EDT ----- LDL is severely elevated at 165 mg/dl. This should be < 70 mg/dL. TGs also elevated at 252. Statin intolerant. I agree with starting PCSK9i that pharmacist recently started. Keep lab appt on 8/3 for repeat FLP. Also encourage low fat diet. Keep plans for cardiac cath tomorrow.

## 2018-09-28 NOTE — Telephone Encounter (Addendum)
Pt contacted pre-catheterization scheduled at [facility] for: Tuesday September 29, 2018 9 AM Verified arrival time and place: [facility] Entrance A at: 7 AM  Covid-19 test date: 09/28/18 [facility] Cepheid-in process  No solid food after midnight prior to cath, clear liquids until 5 AM day of procedure. Contrast allergy:no   AM meds can be  taken pre-cath with sip of water including: ASA 81 mg   Confirm patient has responsible person to drive home post procedure and observe 24 hours after arriving home-yes   Pt advised due to Covid-19 pandemic no visitors are allowed in the hospital. Their designated party will be called when their procedure is over for an update and to arrange pick up.  Per Dr Leanne Chang Daily Covid Update 09/08/18: Universal masks: Effective immediately, we are requiring all ED patients and all ambulatory care patients to wear a universal mask, which we will provide. In-patients will have a mask at their bedside and will only be asked to put it on when a caregiver comes into their room.           COVID-19 Pre-Screening Questions:  . In the past 7 to 10 days have you had a cough,  shortness of breath, headache, congestion, fever (100 or greater) body aches, chills, sore throat, or sudden loss of taste or sense of smell? no . Have you been around anyone with known Covid 19? no . Have you been around anyone who is awaiting Covid 19 test results in the past 7 to 10 days? no . Have you been around anyone who has been exposed to Covid 19, or has mentioned symptoms of Covid 19 within the past 7 to 10 days? no  I reviewed procedure, mask/visitor instructions, Covid 19 screening questions with patient, he verbalized understanding.

## 2018-09-29 ENCOUNTER — Ambulatory Visit (HOSPITAL_COMMUNITY)
Admission: RE | Admit: 2018-09-29 | Discharge: 2018-09-29 | Disposition: A | Discharge status: Home / Self Care | Payer: Commercial Managed Care - PPO | Attending: Cardiovascular Disease | Admitting: Cardiovascular Disease

## 2018-09-29 ENCOUNTER — Other Ambulatory Visit: Payer: Self-pay

## 2018-09-29 ENCOUNTER — Encounter (HOSPITAL_COMMUNITY)
Admission: RE | Disposition: A | Discharge status: Home / Self Care | Payer: Commercial Managed Care - PPO | Source: Home / Self Care | Attending: Cardiovascular Disease

## 2018-09-29 DIAGNOSIS — I440 Atrioventricular block, first degree: Secondary | ICD-10-CM | POA: Diagnosis not present

## 2018-09-29 DIAGNOSIS — I25119 Atherosclerotic heart disease of native coronary artery with unspecified angina pectoris: Secondary | ICD-10-CM | POA: Insufficient documentation

## 2018-09-29 DIAGNOSIS — E785 Hyperlipidemia, unspecified: Secondary | ICD-10-CM | POA: Diagnosis not present

## 2018-09-29 DIAGNOSIS — Z85828 Personal history of other malignant neoplasm of skin: Secondary | ICD-10-CM | POA: Diagnosis not present

## 2018-09-29 DIAGNOSIS — Z79899 Other long term (current) drug therapy: Secondary | ICD-10-CM | POA: Insufficient documentation

## 2018-09-29 DIAGNOSIS — I2582 Chronic total occlusion of coronary artery: Secondary | ICD-10-CM | POA: Diagnosis not present

## 2018-09-29 DIAGNOSIS — Z951 Presence of aortocoronary bypass graft: Secondary | ICD-10-CM | POA: Diagnosis not present

## 2018-09-29 DIAGNOSIS — Z7982 Long term (current) use of aspirin: Secondary | ICD-10-CM | POA: Diagnosis not present

## 2018-09-29 HISTORY — PX: LEFT HEART CATH AND CORS/GRAFTS ANGIOGRAPHY: CATH118250

## 2018-09-29 LAB — BASIC METABOLIC PANEL
Anion gap: 11 (ref 5–15)
BUN: 17 mg/dL (ref 8–23)
CO2: 24 mmol/L (ref 22–32)
Calcium: 9.4 mg/dL (ref 8.9–10.3)
Chloride: 103 mmol/L (ref 98–111)
Creatinine, Ser: 1.27 mg/dL — ABNORMAL HIGH (ref 0.61–1.24)
GFR calc Af Amer: >60 mL/min (ref >60)
GFR calc non Af Amer: >60 mL/min (ref >60)
Glucose, Bld: 100 mg/dL — ABNORMAL HIGH (ref 70–99)
Potassium: 4.3 mmol/L (ref 3.5–5.1)
Sodium: 138 mmol/L (ref 135–145)

## 2018-09-29 LAB — CBC
HCT: 45.9 % (ref 39.0–52.0)
Hemoglobin: 15.4 g/dL (ref 13.0–17.0)
MCH: 30.6 pg (ref 26.0–34.0)
MCHC: 33.6 g/dL (ref 30.0–36.0)
MCV: 91.3 fL (ref 80.0–100.0)
Platelets: 174 10*3/uL (ref 150–400)
RBC: 5.03 MIL/uL (ref 4.22–5.81)
RDW: 12.2 % (ref 11.5–15.5)
WBC: 5.4 10*3/uL (ref 4.0–10.5)
nRBC: 0 % (ref 0.0–0.2)

## 2018-09-29 SURGERY — LEFT HEART CATH AND CORS/GRAFTS ANGIOGRAPHY
Anesthesia: LOCAL

## 2018-09-29 MED ORDER — SODIUM CHLORIDE 0.9% FLUSH: 3.0000 mL | INTRAVENOUS | Status: DC | PRN

## 2018-09-29 MED ORDER — SODIUM CHLORIDE 0.9 % IV SOLN: 250.0000 mL | INTRAVENOUS | Status: DC | PRN

## 2018-09-29 MED ORDER — FENTANYL CITRATE (PF) 100 MCG/2ML IJ SOLN
INTRAMUSCULAR | Status: AC
  Filled 2018-09-29: qty 2

## 2018-09-29 MED ORDER — DIAZEPAM 5 MG PO TABS: 5.0000 mg | ORAL_TABLET | Freq: Four times a day (QID) | ORAL | Status: DC | PRN

## 2018-09-29 MED ORDER — METOPROLOL TARTRATE 12.5 MG HALF TABLET
12.5000 mg | ORAL_TABLET | Freq: Two times a day (BID) | ORAL | Status: DC
  Administered 2018-09-29: 12.5 mg via ORAL
  Filled 2018-09-29 (×2): qty 1

## 2018-09-29 MED ORDER — ASPIRIN 81 MG PO CHEW: 81.0000 mg | CHEWABLE_TABLET | ORAL | Status: DC

## 2018-09-29 MED ORDER — ASPIRIN 81 MG PO CHEW: 81.0000 mg | CHEWABLE_TABLET | Freq: Every day | ORAL | Status: DC

## 2018-09-29 MED ORDER — ONDANSETRON HCL 4 MG/2ML IJ SOLN: 4.0000 mg | Freq: Four times a day (QID) | INTRAMUSCULAR | Status: DC | PRN

## 2018-09-29 MED ORDER — LIDOCAINE HCL (PF) 1 % IJ SOLN
INTRAMUSCULAR | Status: AC
  Filled 2018-09-29: qty 30

## 2018-09-29 MED ORDER — SODIUM CHLORIDE 0.9% FLUSH: 3.0000 mL | Freq: Two times a day (BID) | INTRAVENOUS | Status: DC

## 2018-09-29 MED ORDER — HEPARIN (PORCINE) IN NACL 1000-0.9 UT/500ML-% IV SOLN
INTRAVENOUS | Status: AC
  Filled 2018-09-29: qty 1000

## 2018-09-29 MED ORDER — MIDAZOLAM HCL 2 MG/2ML IJ SOLN
INTRAMUSCULAR | Status: DC | PRN
  Administered 2018-09-29: 2 mg via INTRAVENOUS

## 2018-09-29 MED ORDER — ACETAMINOPHEN 325 MG PO TABS: 650.0000 mg | ORAL_TABLET | ORAL | Status: DC | PRN

## 2018-09-29 MED ORDER — HYDRALAZINE HCL 20 MG/ML IJ SOLN
INTRAMUSCULAR | Status: AC
  Filled 2018-09-29: qty 1

## 2018-09-29 MED ORDER — HYDRALAZINE HCL 20 MG/ML IJ SOLN: 10.0000 mg | INTRAMUSCULAR | Status: DC | PRN

## 2018-09-29 MED ORDER — FENTANYL CITRATE (PF) 100 MCG/2ML IJ SOLN
INTRAMUSCULAR | Status: DC | PRN
  Administered 2018-09-29: 25 ug via INTRAVENOUS

## 2018-09-29 MED ORDER — SODIUM CHLORIDE 0.9 % WEIGHT BASED INFUSION: 1.0000 mL/kg/h | INTRAVENOUS | Status: DC

## 2018-09-29 MED ORDER — IOHEXOL 350 MG/ML SOLN
INTRAVENOUS | Status: DC | PRN
  Administered 2018-09-29: 155 mL via INTRA_ARTERIAL

## 2018-09-29 MED ORDER — LIDOCAINE HCL (PF) 1 % IJ SOLN
INTRAMUSCULAR | Status: DC | PRN
  Administered 2018-09-29: 16 mL

## 2018-09-29 MED ORDER — ALIROCUMAB 75 MG/ML ~~LOC~~ SOAJ: 75.0000 mg | SUBCUTANEOUS | 3 refills | Status: DC

## 2018-09-29 MED ORDER — MIDAZOLAM HCL 2 MG/2ML IJ SOLN
INTRAMUSCULAR | Status: AC
  Filled 2018-09-29: qty 2

## 2018-09-29 MED ORDER — SODIUM CHLORIDE 0.9 % IV SOLN: INTRAVENOUS | Status: DC

## 2018-09-29 MED ORDER — HYDRALAZINE HCL 20 MG/ML IJ SOLN
INTRAMUSCULAR | Status: DC | PRN
  Administered 2018-09-29 (×2): 10 mg via INTRAVENOUS

## 2018-09-29 MED ORDER — LABETALOL HCL 5 MG/ML IV SOLN: 10.0000 mg | INTRAVENOUS | Status: DC | PRN

## 2018-09-29 MED ORDER — SODIUM CHLORIDE 0.9 % WEIGHT BASED INFUSION
3.0000 mL/kg/h | INTRAVENOUS | Status: DC
  Administered 2018-09-29: 3 mL/kg/h via INTRAVENOUS

## 2018-09-29 MED ORDER — HEPARIN (PORCINE) IN NACL 1000-0.9 UT/500ML-% IV SOLN
INTRAVENOUS | Status: DC | PRN
  Administered 2018-09-29 (×2): 500 mL

## 2018-09-29 SURGICAL SUPPLY — 14 items
CATH INFINITI 5 FR AR1 MOD (CATHETERS) ×2 IMPLANT
CATH INFINITI 5 FR IM (CATHETERS) ×2 IMPLANT
CATH INFINITI 5 FR LCB (CATHETERS) ×2 IMPLANT
CATH INFINITI 5 FR RCB (CATHETERS) ×2 IMPLANT
CATH INFINITI 5FR AL1 (CATHETERS) ×2 IMPLANT
CATH INFINITI 5FR MULTPACK ANG (CATHETERS) ×2 IMPLANT
KIT HEART LEFT (KITS) ×2 IMPLANT
PACK CARDIAC CATHETERIZATION (CUSTOM PROCEDURE TRAY) ×2 IMPLANT
SHEATH PINNACLE 5F 10CM (SHEATH) ×2 IMPLANT
SHEATH PINNACLE 6F 10CM (SHEATH) ×2 IMPLANT
SYR MEDRAD MARK 7 150ML (SYRINGE) ×2 IMPLANT
TRANSDUCER W/STOPCOCK (MISCELLANEOUS) ×2 IMPLANT
TUBING CIL FLEX 10 FLL-RA (TUBING) ×2 IMPLANT
WIRE EMERALD 3MM-J .035X150CM (WIRE) ×2 IMPLANT

## 2018-09-29 NOTE — Addendum Note (Signed)
Addended by: Jeremy Johann on: 09/29/2018 01:38 PM   Modules accepted: Orders

## 2018-09-29 NOTE — Interval H&P Note (Signed)
Cath Lab Visit (complete for each Cath Lab visit)  Clinical Evaluation Leading to the Procedure:   ACS: No.  Non-ACS:    Anginal Classification: CCS III  Anti-ischemic medical therapy: Minimal Therapy (1 class of medications)  Non-Invasive Test Results: No non-invasive testing performed  Prior CABG: Previous CABG      History and Physical Interval Note:  09/29/2018 9:29 AM  Kenneth Vance  has presented today for surgery, with the diagnosis of angina.  The various methods of treatment have been discussed with the patient and family. After consideration of risks, benefits and other options for treatment, the patient has consented to  Procedure(s): LEFT HEART CATH AND CORS/GRAFTS ANGIOGRAPHY (N/A) as a surgical intervention.  The patient's history has been reviewed, patient examined, no change in status, stable for surgery.  I have reviewed the patient's chart and labs.  Questions were answered to the patient's satisfaction.     Shelva Majestic

## 2018-09-29 NOTE — Progress Notes (Signed)
No bleeding or hematoma noted after ambulation 

## 2018-09-29 NOTE — Telephone Encounter (Signed)
Pt approved for Praluent 75mg  through insurance.   He does have Pharmacist, community and should qualify for copay card, which was provided to him during recent visit.

## 2018-09-29 NOTE — Progress Notes (Signed)
Discharge instructions reviewed with pt and his wife voices understanding.

## 2018-09-29 NOTE — Discharge Instructions (Signed)
Femoral Site Care °This sheet gives you information about how to care for yourself after your procedure. Your health care provider may also give you more specific instructions. If you have problems or questions, contact your health care provider. °What can I expect after the procedure? °After the procedure, it is common to have: °· Bruising that usually fades within 1-2 weeks. °· Tenderness at the site. °Follow these instructions at home: °Wound care °· Follow instructions from your health care provider about how to take care of your insertion site. Make sure you: °? Wash your hands with soap and water before you change your bandage (dressing). If soap and water are not available, use hand sanitizer. °? Change your dressing as told by your health care provider. °? Leave stitches (sutures), skin glue, or adhesive strips in place. These skin closures may need to stay in place for 2 weeks or longer. If adhesive strip edges start to loosen and curl up, you may trim the loose edges. Do not remove adhesive strips completely unless your health care provider tells you to do that. °· Do not take baths, swim, or use a hot tub until your health care provider approves. °· You may shower 24-48 hours after the procedure or as told by your health care provider. °? Gently wash the site with plain soap and water. °? Pat the area dry with a clean towel. °? Do not rub the site. This may cause bleeding. °· Do not apply powder or lotion to the site. Keep the site clean and dry. °· Check your femoral site every day for signs of infection. Check for: °? Redness, swelling, or pain. °? Fluid or blood. °? Warmth. °? Pus or a bad smell. °Activity °· For the first 2-3 days after your procedure, or as long as directed: °? Avoid climbing stairs as much as possible. °? Do not squat. °· Do not lift anything that is heavier than 10 lb (4.5 kg), or the limit that you are told, until your health care provider says that it is safe. °· Rest as  directed. °? Avoid sitting for a long time without moving. Get up to take short walks every 1-2 hours. °· Do not drive for 24 hours if you were given a medicine to help you relax (sedative). °General instructions °· Take over-the-counter and prescription medicines only as told by your health care provider. °· Keep all follow-up visits as told by your health care provider. This is important. °Contact a health care provider if you have: °· A fever or chills. °· You have redness, swelling, or pain around your insertion site. °Get help right away if: °· The catheter insertion area swells very fast. °· You pass out. °· You suddenly start to sweat or your skin gets clammy. °· The catheter insertion area is bleeding, and the bleeding does not stop when you hold steady pressure on the area. °· The area near or just beyond the catheter insertion site becomes pale, cool, tingly, or numb. °These symptoms may represent a serious problem that is an emergency. Do not wait to see if the symptoms will go away. Get medical help right away. Call your local emergency services (911 in the [place] Do not drive yourself to the hospital. °Summary °· After the procedure, it is common to have bruising that usually fades within 1-2 weeks. °· Check your femoral site every day for signs of infection. °· Do not lift anything that is heavier than 10 lb (4.5 kg), or the   limit that you are told, until your health care provider says that it is safe. °This information is not intended to replace advice given to you by your health care provider. Make sure you discuss any questions you have with your health care provider. °Document Released: 12/17/2013 Document Revised: 04/28/2017 Document Reviewed: 04/28/2017 °Elsevier Interactive Patient Education © 2019 [employer] ° °

## 2018-09-29 NOTE — Addendum Note (Signed)
Addended by: Erskine Emery on: 09/29/2018 08:29 AM   Modules accepted: Orders

## 2018-09-30 ENCOUNTER — Encounter (HOSPITAL_COMMUNITY): Payer: Self-pay | Admitting: Cardiovascular Disease

## 2018-10-01 NOTE — Addendum Note (Signed)
Addended by: Jeremy Johann on: 10/01/2018 11:03 AM   Modules accepted: Orders

## 2018-10-08 NOTE — Telephone Encounter (Signed)
Called patient per request from Dr. Acie Fredrickson. Patient states the cath puncture site is closed and that the knot is located at the site of the puncture. He states he is able to push down on it. Reports that bruising is occurring medially down the thigh and denies any bruising on the lateral aspect of leg or in his back or buttock region. He states he has been walking without difficulty. I advised that the picture and the description sound like normal physiology and that he can call back with questions or concerns. I advised that we do have providers in the office that can take a look at the site if he has concerns or if bruising or swelling worsens. He states he feels good with our conversation and will call back if anything changes. He thanked me for the call.

## 2018-11-30 ENCOUNTER — Other Ambulatory Visit: Payer: Self-pay | Admitting: *Deleted

## 2018-11-30 ENCOUNTER — Other Ambulatory Visit: Payer: Self-pay

## 2018-11-30 ENCOUNTER — Other Ambulatory Visit: Payer: Commercial Managed Care - PPO | Admitting: *Deleted

## 2018-11-30 DIAGNOSIS — E785 Hyperlipidemia, unspecified: Secondary | ICD-10-CM

## 2018-11-30 LAB — LIPID PANEL
Chol/HDL Ratio: 5 ratio (ref 0.0–5.0)
Cholesterol, Total: 227 mg/dL — ABNORMAL HIGH (ref 100–199)
HDL: 45 mg/dL (ref >39)
LDL Calculated: 136 mg/dL — ABNORMAL HIGH (ref 0–99)
Triglycerides: 229 mg/dL — ABNORMAL HIGH (ref 0–149)
VLDL Cholesterol Cal: 46 mg/dL — ABNORMAL HIGH (ref 5–40)

## 2018-11-30 NOTE — Progress Notes (Signed)
.  fal

## 2018-12-01 ENCOUNTER — Telehealth: Payer: Self-pay | Admitting: Pharmacist

## 2018-12-01 DIAGNOSIS — E785 Hyperlipidemia, unspecified: Secondary | ICD-10-CM

## 2018-12-01 MED ORDER — VASCEPA 1 G PO CAPS: 2.0000 | ORAL_CAPSULE | Freq: Two times a day (BID) | ORAL | 3 refills | Status: DC

## 2018-12-01 MED ORDER — NEXLETOL 180 MG PO TABS: 1.0000 | ORAL_TABLET | Freq: Every day | ORAL | 3 refills | Status: DC

## 2018-12-01 NOTE — Telephone Encounter (Signed)
Lipid panel drawn yesterday reveals elevated LDL of 136 and TG of 229. Patient is intolerant to Crestor 10mg  every other day, simvastatin, atorvastatin, pravastatin, Livalo, fenofibrate, Zetia 10mg  daily, and Repatha - myalgias with all.   LDL goal < 70 due to history of CAD and CABG. Baseline LDL is 200. He was started on Praluent injections in May 2020. LDL of 136 reflects 32% LDL reduction. Spoke with pt who states he is tolerating Praluent well, he is hesitant to increase his dose given history of myalgias on Repatha.  He is eating more of a plant based diet with some chicken and fish. Otherwise, he has cut out red meat and dairy from his diet and is eating more fruits and vegetables. He weighs < 200 lbs for the first time in a while.  Discussed starting Vascepa for elevated TG and CV benefit, as well as Nexletol to further lower LDL. He is agreeable to trying both of these medications. Will submit to his insurance for approval and follow up with pt after.

## 2018-12-01 NOTE — Telephone Encounter (Signed)
I appreciate the great work by [employer], [facility]. Continue current meds

## 2018-12-01 NOTE — Telephone Encounter (Signed)
Prior authorizations for Vascepa and Nexletol both approved immediately - Nexletol through 12/01/19 and Vascepa through 11/30/21. Rx sent to local pharmacy, copay cards activated for each ($10 for 3 month supply of Nexletol, $9 for 3 month supply of Vascepa). Pt is aware and will call with concerns. Scheduled f/u labs in 3 months.

## 2018-12-02 ENCOUNTER — Telehealth: Payer: Self-pay

## 2018-12-02 NOTE — Telephone Encounter (Signed)
Notes recorded by Frederik Schmidt, RN on 12/02/2018 at 4:37 PM EDT  lpmtcb 8/5  ------

## 2018-12-02 NOTE — Telephone Encounter (Signed)
-----   Message from [place], [place] sent at 12/02/2018  4:16 PM EDT ----- LDL and TGs extremely high and needs better control given CAD. It looks like the lipid clinic has already addressed this and are starting new meds and have sent Rx to pharmacy. Please f/u to see if pt picked up meds and is compliant w/ medication.

## 2018-12-03 ENCOUNTER — Telehealth: Payer: Self-pay

## 2018-12-03 NOTE — Telephone Encounter (Signed)
-----   Message from [place], [place] sent at 12/02/2018  4:16 PM EDT ----- LDL and TGs extremely high and needs better control given CAD. It looks like the lipid clinic has already addressed this and are starting new meds and have sent Rx to pharmacy. Please f/u to see if pt picked up meds and is compliant w/ medication.

## 2018-12-03 NOTE — Telephone Encounter (Signed)
Notes recorded by Frederik Schmidt, RN on 12/03/2018 at 12:59 PM EDT  lpmtcb 8/6  ------

## 2018-12-07 ENCOUNTER — Telehealth: Payer: Self-pay

## 2018-12-07 NOTE — Telephone Encounter (Signed)
Notes recorded by Frederik Schmidt, RN on 12/07/2018 at 11:59 AM EDT  lpmtcb 8/10  ------

## 2018-12-07 NOTE — Telephone Encounter (Signed)
-----   Message from [place], [place] sent at 12/02/2018  4:16 PM EDT ----- LDL and TGs extremely high and needs better control given CAD. It looks like the lipid clinic has already addressed this and are starting new meds and have sent Rx to pharmacy. Please f/u to see if pt picked up meds and is compliant w/ medication.

## 2019-03-08 ENCOUNTER — Other Ambulatory Visit: Payer: Self-pay

## 2019-03-08 ENCOUNTER — Other Ambulatory Visit: Payer: Commercial Managed Care - PPO | Admitting: *Deleted

## 2019-03-08 DIAGNOSIS — E785 Hyperlipidemia, unspecified: Secondary | ICD-10-CM

## 2019-03-08 LAB — LIPID PANEL
Chol/HDL Ratio: 6.1 ratio — ABNORMAL HIGH (ref 0.0–5.0)
Cholesterol, Total: 261 mg/dL — ABNORMAL HIGH (ref 100–199)
HDL: 43 mg/dL (ref >39)
LDL Chol Calc (NIH): 173 mg/dL — ABNORMAL HIGH (ref 0–99)
Triglycerides: 239 mg/dL — ABNORMAL HIGH (ref 0–149)
VLDL Cholesterol Cal: 45 mg/dL — ABNORMAL HIGH (ref 5–40)

## 2019-03-08 LAB — COMPREHENSIVE METABOLIC PANEL
ALT: 13 IU/L (ref 0–44)
AST: 12 IU/L (ref 0–40)
Albumin/Globulin Ratio: 2 (ref 1.2–2.2)
Albumin: 4.7 g/dL (ref 3.8–4.8)
Alkaline Phosphatase: 83 IU/L (ref 39–117)
BUN/Creatinine Ratio: 12 (ref 10–24)
BUN: 15 mg/dL (ref 8–27)
Bilirubin Total: 0.6 mg/dL (ref 0.0–1.2)
CO2: 23 mmol/L (ref 20–29)
Calcium: 9.5 mg/dL (ref 8.6–10.2)
Chloride: 100 mmol/L (ref 96–106)
Creatinine, Ser: 1.24 mg/dL (ref 0.76–1.27)
GFR calc Af Amer: 72 mL/min/{1.73_m2} (ref >59)
GFR calc non Af Amer: 62 mL/min/{1.73_m2} (ref >59)
Globulin, Total: 2.3 g/dL (ref 1.5–4.5)
Glucose: 94 mg/dL (ref 65–99)
Potassium: 4.4 mmol/L (ref 3.5–5.2)
Sodium: 139 mmol/L (ref 134–144)
Total Protein: 7 g/dL (ref 6.0–8.5)

## 2019-03-11 ENCOUNTER — Telehealth: Payer: Self-pay | Admitting: Pharmacist

## 2019-03-11 NOTE — Telephone Encounter (Addendum)
Called pt to discuss follow up lipids and left message.  He's intolerant to Crestor 10mg  every other day, simvastatin, atorvastatin, pravastatin, Livalo, fenofibrate, Zetia 10mg  daily, and Repatha - myalgias with all.   Baseline LDL is > 200.  Lipids drawn in August: LDL 136, TG 229 on Praluent 75mg  Q2W (32% LDL reduction). Pt did not want to increase his dose of Praluent due to previous myalgias with Repatha. He was agreeable to starting Vascepa and Nexletol.  Lipids drawn this week: LDL 173, TG 239. Pt should be taking Pralulent, Nexletol, and Vascepa, however both his TG and LDL have increased.  Left message for pt to discuss. Need to confirm that he is actually taking Praluent, Nexletol and Vascepa. Suspect he is not since his lipids increased notably.  Can try increasing Praluent dose if pt willing or inclisiran once FDA approved in a few months. He won't qualify for ORION-4 or Vesalius trials.

## 2019-03-12 NOTE — Telephone Encounter (Signed)
Pt returned call to clinic, scheduled lipid appt next Tuesday.

## 2019-03-15 NOTE — Progress Notes (Signed)
Patient ID: Kenneth Vance                 DOB: 1955/08/24                    MRN: [id number]     HPI: Kenneth Vance is a 63 y.o. male patient referred to lipid clinic by Dr. Cathie Olden. PMH is significant for CAD s/p CABG x4 (03/2015) and HLD with statin intolerance. Patient previously seen in lipid clinic in 2017, was lost to follow up in clinic until May 2020 when he presented for follow up due to chest pain. Troponin was 0.479 and pt was sent for cardiac cath on 6/2 due to unstable angina. Cath revealed native CAD with 98 to 50% distal left main stenosis; total occlusion of the LAD at the ostium, diffuse 70% stenosis in the OM1 branch of the left circumflex coronary artery, with an upward takeoff small OM 2 vessel with 90% ostial stenosis; and abnormal takeoff of the RCA with 70% proximal stenosis. He was also found to have mild LV dysfunction with EF estimated at 45 to less than 50%. Medical therapy was recommended.   Pt was started on Praluent at that time, f/u lipid panel showed improvement in LDL to 136, TG elevated at 229. He did not want to increase his dose of Praluent due to previous myalgias with Repatha. He was agreeable to starting Vascepa and Nexletol. His lipids were drawn again on 03/08/2019 showing LDL 173, TG 239. Pt reported medication adherence and was scheduled in lipid clinic for follow up.  Due to increase in LDL and TG despite pt reported adherence to Praluent, Vascepa, and Nexletol, pharmacies on record were called today to obtain fill history. [facility]: filled Nexletol (30 day supply) on August 5th. [facility]: [facility] Vascepa (30 day supply) on August 5th, and Praluent (30 day supply) on June 3rd.   Today, patient presents to clinic in good spirits. Pt initially reports adherence to medications, however after discussing medication refill history, he states that he stopped Praluent after 2 injections back in June secondary to myalgias and nausea. States he has been  taking Vascepa 1 capsule a day (should be taking 2 capsules BID, and has been out of Nexletol for a while. Would prefer [place] [facility] on since [facility] didn't honor his copay cards.   Patient would be interested in considering inclisiran when it is approved. He asked about potentially trying Niacin again (had tried in in the past with some flushing) to help lower his cholesterol. He also expressed interest in learning more on potentially participating in current cholesterol studies, and provided consent for registry study. He reports not taking Imdur as he has not had chest pain since the 5/29 appt.   Current Medications: Nexletol 152m daily, Vascepa 2g BID - non adherent to either  Intolerances: Crestor 134mevery other day, simvastatin, atorvastatin, pravastatin, Livalo, fenofibrate, Zetia 1081maily, Repatha (stopped in 2018 d/t myalgias), and Praluent - myalgias with all.   Risk Factors: CAD s/p CABG   LDL goal: <70 mg/dL  Diet: Reports he has tried to make positive diet changes and stays away from red meats as much as possible.  Breakfast: fruits, sunflower butter, whole what toast, french toast once a week Lunch: lean grilled chicken or roast beef, occasionally fast food  Dinner: Home cooked the last couple months, lots of grilled salmon, grilled chicken, steak, roast beef, pork  chops occasionally, veggies, salads   Exercise: Walking 3-5 miles 3-4 times a week, but experiences chest pain when attempting to run.   Family History: Hyperlipidemia in his mother, pulmonary fibrosis and CABG x7 in his father. 4 uncles on his mother's side died in their 54s from heart disease.  Social History: Non-smoker or illicit drug user. Occasional alcohol use.   Labs: 03/08/2019: TC 261, TG 239, HDL 43, LDL 173 (none) 11/30/2018: TC 227, TG 229, HDL 45, LDL 136 (Praluent 48m)  05/14/2017: TC 275, TG 183, HDL 38, LDL 200 (no therapy)  10/04/15: TC 202, TG 157, HDL 45, LDL 126 (on  Zetia 139mdaily - had d/c'ed a few days prior to bloodwork being drawn) 05/11/15: TC 258, TG 195, HDL 46, LDL 172 (no therapy) Past Medical History:  Diagnosis Date   BCC (basal cell carcinoma), ear    CAD in native artery 03/29/15   severe CAD    History of kidney stones    Hyperlipidemia    Pneumonia    as infant   S/P CABG x 4 03/31/15    Current Outpatient Medications on File Prior to Visit  Medication Sig Dispense Refill   Alirocumab (PRALUENT) 75 MG/ML SOAJ Inject 75 mg into the skin every 14 (fourteen) days. 6 pen 3   aspirin 81 MG EC tablet Take 1 tablet (81 mg total) by mouth daily.     Bempedoic Acid (NEXLETOL) 180 MG TABS Take 1 tablet by mouth daily. 90 tablet 3   ibuprofen (ADVIL) 200 MG tablet Take 400 mg by mouth every 6 (six) hours as needed for moderate pain.     Icosapent Ethyl (VASCEPA) 1 g CAPS Take 2 capsules (2 g total) by mouth 2 (two) times daily. 360 capsule 3   isosorbide mononitrate (IMDUR) 30 MG 24 hr tablet Take 1 tablet (30 mg total) by mouth daily. 90 tablet 1   nitroGLYCERIN (NITROSTAT) 0.4 MG SL tablet Place 1 tablet (0.4 mg total) under the tongue every 5 (five) minutes as needed for chest pain. 25 tablet 6   No current facility-administered medications on file prior to visit.     Allergies  Allergen Reactions   Peanut-Containing Drug Products Shortness Of Breath and Swelling    migraine   Crestor [Rosuvastatin Calcium] Other (See Comments)    Myalgias   Repatha [Evolocumab] Other (See Comments)    Muscle pain    Zetia [Ezetimibe] Other (See Comments)    Myalgias   Fenofibrate     Muscle pain   Lipitor [Atorvastatin] Other (See Comments)    myalgia   Livalo [Pitavastatin] Other (See Comments)    myalgia   Niacin And Related Other (See Comments)    Flushing, feels weak    Zocor [Simvastatin] Other (See Comments)    myalgia    Assessment/Plan:  1. Hyperlipidemia - LDL goal <70 mg/dL. Patient's LDL has remained  elevated at 173 with elevated TG of 26834complicated by non-adherence to current regimen and intolerance to most recent trial of Praluent. Patient was also incorrectly taking 1 capsule of Vascepa daily. Increase Vascepa dose to 2g BID (two capsules twice a day), and resume Nexletol 18019maily. Start Niaspan 500m87mily (1 tablet in evening, 30 minutes after aspirin). Encouraged patient to continue with his heart healthy diet and current exercise regimen. Will call pt to discuss adding inclisiran once FDA approved (won't qualify for ORION trial due to lack of MI history).  ThomClaudina LickarmD Candidate [id number]  [facility] [address], [place] [address] Phone: [phone]; Fax: [phone]  Megan E. Supple, PharmD, BCACP, [place] [address] [place], [place] [address] Phone: [phone]; Fax: [phone] 03/16/2019 11:46 AM    [employer] Evolocumab [id number] Site No: [id number] Subject ID No: [id number]     Did subject meet all eligibility criteria?  Code  No Yes  38  Adults age ? 40 years '[]'  '[x]'   One or both of the following:   102 A  Hospitalization for a clinical ASCVD event: acute (MI), UA, IS, or CLI within 18 months of enrollment        Note: Subjects must have been admitted to the hospital. Those       who are admitted and discharged in less than 24 hours are      Eligible for the study.Subjects who have been admitted to     The ER for a clinical ASCVD. '[]'  '[x]'   102B Coronary or peripheral revascularization including percutaneous or surgical revascularization in the past 18 months '[x]'  '[]'   One of the following:  103 A  LDL ? 70 mg/dL (1.81 mmol/L) with no plans for immediate initiation or titration of statin therapy '[]'  '[x]'   103 B Newly started on PCSK9i after the index hospitalization/procedure and prior to enrollment (but no more than 6 months prior to enrollment) with pre-PCSK9i treatment LDL-C value available and  measured within 6 months of starting PCSK9i and known background LLT any time prior to PCSK9i initiation. '[x]'  '[]'   105 Planned follow-up within the health system '[]'  '[x]'   Subject does not meet any of the following exclusion criteria   201 Unable or unwilling to provide informed consent, including but not limited to cognitive or language barriers (reading or comprehension) '[x]'  '[]'   202 Lack of phone or email for contact '[x]'  '[]'   203 Evidence of end stage renal disease (ESRD) or stage 5 CKD '[x]'  '[]'   204 Anticipated life expectancy less than 6 month  '[x]'  '[]'   206  On a PCSK9i prior to their qualifying event '[x]'  '[]'      Subject Name: Kenneth Vance  Subject met inclusion and exclusion criteria.  The informed consent form, study requirements and expectations were reviewed with the subject and questions and concerns were addressed prior to the signing of the consent form.  The subject verbalized understanding of the trial requirements.  The subject agreed to participate in the [place] trial and signed the informed consent at 10:00am on11/17/20.  The informed consent was obtained prior to performance of any protocol-specific procedures for the subject.  A copy of the signed informed consent was given to the subject and a copy was placed in the subject's medical record.   Megan Supple   Socioeconomic Status - Baseline  Education Some High School or Less High School graduate Some College/University Graduated College/University or above '[]'  '[]'  '[]'  '[x]'    Marital Status Married Single Divorced Separated '[x]'  '[]'  '[]'  '[]'    Income <$15,000 $15,001 - $34,999 $35,000 - $74,999 $75,000 - $99,999 >$100,000 '[]'  '[]'  '[]'  '[]'  '[x]'

## 2019-03-15 NOTE — Patient Instructions (Addendum)
Thank you for visiting [place] today!  Your goal LDL (bad cholesterol) is <70 mg/dL. Your last LDL was above goal at 173, and your triglycerides (fatty cholesterol) were high at 239.   Medication Changes: Take Nexletol 180mg  1 tablet daily, Vascepa 2g two capsules twice a day (total 4 capsule every day), and Niaspan 500mg  1 tablet each evening (30 mins after your aspirin) to help lower your cholesterol  Great job with maintaining a heart healthy diet! Keep up the good work with exercising as tolerated.    If you have any further questions or concerns, please give [place] a call at [phone].

## 2019-03-16 ENCOUNTER — Other Ambulatory Visit: Payer: Self-pay

## 2019-03-16 ENCOUNTER — Ambulatory Visit (INDEPENDENT_AMBULATORY_CARE_PROVIDER_SITE_OTHER): Payer: Commercial Managed Care - PPO | Admitting: Pharmacist

## 2019-03-16 VITALS — BP 120/80 | HR 63 | Wt 209.0 lb

## 2019-03-16 DIAGNOSIS — T466X5A Adverse effect of antihyperlipidemic and antiarteriosclerotic drugs, initial encounter: Secondary | ICD-10-CM | POA: Diagnosis not present

## 2019-03-16 DIAGNOSIS — G720 Drug-induced myopathy: Secondary | ICD-10-CM

## 2019-03-16 DIAGNOSIS — E782 Mixed hyperlipidemia: Secondary | ICD-10-CM | POA: Diagnosis not present

## 2019-03-16 MED ORDER — VASCEPA 1 G PO CAPS: 2.0000 g | ORAL_CAPSULE | Freq: Two times a day (BID) | ORAL | 3 refills | Status: DC

## 2019-03-16 MED ORDER — NIACIN ER (ANTIHYPERLIPIDEMIC) 500 MG PO TBCR: 500.0000 mg | EXTENDED_RELEASE_TABLET | Freq: Every day | ORAL | 3 refills | Status: DC

## 2019-03-16 MED ORDER — NEXLETOL 180 MG PO TABS: 1.0000 | ORAL_TABLET | Freq: Every day | ORAL | 3 refills | Status: DC

## 2019-03-22 ENCOUNTER — Telehealth: Payer: Self-pay | Admitting: *Deleted

## 2019-03-22 NOTE — Telephone Encounter (Signed)
LVM to return call to [facility] [phone] for fasting lab draw for Cv Mobius trial.

## 2019-06-09 ENCOUNTER — Other Ambulatory Visit: Payer: Self-pay | Admitting: Cardiovascular Disease

## 2019-06-09 DIAGNOSIS — E782 Mixed hyperlipidemia: Secondary | ICD-10-CM

## 2019-06-09 MED ORDER — ICOSAPENT ETHYL 1 G PO CAPS: 2.0000 g | ORAL_CAPSULE | Freq: Two times a day (BID) | ORAL | 0 refills | Status: DC

## 2019-06-09 NOTE — Telephone Encounter (Signed)
Pt is requesting his medication to be resent to [facility] mail order pharmacy. Please address

## 2019-06-11 ENCOUNTER — Telehealth: Payer: Self-pay | Admitting: Pharmacist

## 2019-06-11 MED ORDER — ICOSAPENT ETHYL 1 G PO CAPS: 2.0000 g | ORAL_CAPSULE | Freq: Two times a day (BID) | ORAL | 3 refills | Status: DC

## 2019-06-11 MED ORDER — NEXLETOL 180 MG PO TABS: 1.0000 | ORAL_TABLET | Freq: Every day | ORAL | 3 refills | Status: DC

## 2019-06-11 NOTE — Telephone Encounter (Signed)
Pt requested Nexletol be sent to mail order pharmacy. Advised pt that mail order pharmacy will not allow him to continue using copay card for Nexletol.  Called pt, [facility] and Nexletol. They should have copay card info for discounted prices, cheaper than mail order. Pt aware [facility] will [facility] to [facility] up with [facility].  He also asked for me to schedule lipid panel and LFTs - he reports improved adherence with Vascepa and Nexletol. Scheduled for 2/22 per pt request.  Called pharmacy - copay cards had expired. Provided new card info. Vascepa still expensive at $183/1 month supply ($300 with just insurance) - pt likely has deductible. Nexletol copay card did not go through, rejected saying pt did not qualify. Provided pharmacy with # to call [facility] to resolve this.  Pt states Vascepa is $100 for a 3 month supply at mail order - this has been sent in. Advised him to [facility] in a few days to see if they are able to run the [facility] copay card. If not, will send an rx to mail order (also $100 for a 3 month supply).

## 2019-06-11 NOTE — Telephone Encounter (Signed)
Mail order pharmacy will not allow him to continue using copay card for Nexletol.  Called pt, [facility] states [facility] and Nexletol. They should have copay card info for discounted prices, cheaper than mail order. Pt aware [facility] will [facility] to follow up with this.  He also asked for me to schedule lipid panel and LFTs - he reports improved adherence with Vascepa and Nexletol. Scheduled for 2/22 per pt request.  Called pharmacy - copay cards had expired. Provided new card info. Vascepa still expensive at $183/1 month supply ($300 with just insurance) - pt likely has deductible. Nexletol copay card did not go through, rejected saying pt did not qualify. Provided pharmacy with # to call [facility] to resolve this.  Pt states Vascepa is $100 for a 3 month supply at mail order - this has been sent in. Advised [facility] to [facility] in a few days to see if they are able to run the [facility] copay card. If not, will send an rx to mail order (also $100 for a 3 month supply).

## 2019-06-21 ENCOUNTER — Other Ambulatory Visit: Payer: Self-pay

## 2019-06-21 ENCOUNTER — Other Ambulatory Visit: Payer: Commercial Managed Care - PPO | Admitting: *Deleted

## 2019-06-21 DIAGNOSIS — E782 Mixed hyperlipidemia: Secondary | ICD-10-CM

## 2019-06-21 LAB — HEPATIC FUNCTION PANEL
ALT: 13 IU/L (ref 0–44)
AST: 17 IU/L (ref 0–40)
Albumin: 4.6 g/dL (ref 3.8–4.8)
Alkaline Phosphatase: 48 IU/L (ref 39–117)
Bilirubin Total: 0.7 mg/dL (ref 0.0–1.2)
Bilirubin, Direct: 0.17 mg/dL (ref 0.00–0.40)
Total Protein: 6.9 g/dL (ref 6.0–8.5)

## 2019-06-21 LAB — LIPID PANEL
Chol/HDL Ratio: 4.7 ratio (ref 0.0–5.0)
Cholesterol, Total: 193 mg/dL (ref 100–199)
HDL: 41 mg/dL (ref >39)
LDL Chol Calc (NIH): 128 mg/dL — ABNORMAL HIGH (ref 0–99)
Triglycerides: 136 mg/dL (ref 0–149)
VLDL Cholesterol Cal: 24 mg/dL (ref 5–40)

## 2019-06-21 MED ORDER — NEXLETOL 180 MG PO TABS: 1.0000 | ORAL_TABLET | Freq: Every day | ORAL | 3 refills | Status: DC

## 2019-10-21 ENCOUNTER — Telehealth: Payer: Self-pay | Admitting: Pharmacist

## 2019-10-21 NOTE — Telephone Encounter (Signed)
Patient called stating her got new insurance [employer] ID [id number] BIN [id number] PCN [id number] Will need to resubmit PA for Vascepa and Nexletol

## 2019-10-21 NOTE — Telephone Encounter (Signed)
Called the patients wife and they stated that they were aware that their insurance is messed up. They will call when the insurance fixes it so that we can process a pa for nexletol and vascepa

## 2019-10-25 NOTE — Telephone Encounter (Signed)
Pa submitted for vascepa and nexletol today

## 2019-10-27 ENCOUNTER — Telehealth: Payer: Self-pay

## 2019-10-27 ENCOUNTER — Other Ambulatory Visit: Payer: Self-pay

## 2019-10-27 DIAGNOSIS — E785 Hyperlipidemia, unspecified: Secondary | ICD-10-CM

## 2019-10-27 MED ORDER — ICOSAPENT ETHYL 1 G PO CAPS: 2.0000 g | ORAL_CAPSULE | Freq: Two times a day (BID) | ORAL | 3 refills | Status: DC

## 2019-10-27 NOTE — Telephone Encounter (Signed)
Pt called requesting to speak with a pharmacist even after talking to me and samples placed at [facility] for the pt.

## 2019-10-27 NOTE — Telephone Encounter (Signed)
I called patient per request. No answer. Mailbox was full. Will try again later.

## 2019-10-28 NOTE — Telephone Encounter (Signed)
Spoke with patient. He was asking about a trial that Jinny Blossom was looking into. He does not qualify for Orion but he is on our list to discuss inclisirin once FDA approved. Explained that this has been delayed due to [place]. He would like to have his lipids rechecked. He has been off medication close to a week. Will recheck in 1 month.

## 2019-12-01 ENCOUNTER — Other Ambulatory Visit: Payer: Self-pay

## 2019-12-01 ENCOUNTER — Other Ambulatory Visit: Payer: Managed Care, Other (non HMO) | Admitting: *Deleted

## 2019-12-01 DIAGNOSIS — E785 Hyperlipidemia, unspecified: Secondary | ICD-10-CM

## 2019-12-01 LAB — HEPATIC FUNCTION PANEL
ALT: 11 IU/L (ref 0–44)
AST: 17 IU/L (ref 0–40)
Albumin: 4.6 g/dL (ref 3.8–4.8)
Alkaline Phosphatase: 45 IU/L — ABNORMAL LOW (ref 48–121)
Bilirubin Total: 0.8 mg/dL (ref 0.0–1.2)
Bilirubin, Direct: 0.22 mg/dL (ref 0.00–0.40)
Total Protein: 6.8 g/dL (ref 6.0–8.5)

## 2019-12-01 LAB — LIPID PANEL
Chol/HDL Ratio: 3.6 ratio (ref 0.0–5.0)
Cholesterol, Total: 154 mg/dL (ref 100–199)
HDL: 43 mg/dL (ref >39)
LDL Chol Calc (NIH): 95 mg/dL (ref 0–99)
Triglycerides: 82 mg/dL (ref 0–149)
VLDL Cholesterol Cal: 16 mg/dL (ref 5–40)

## 2020-05-30 ENCOUNTER — Encounter: Payer: Self-pay | Admitting: Pharmacist

## 2020-05-30 NOTE — Telephone Encounter (Signed)
This encounter was created in error - please disregard.

## 2020-07-10 ENCOUNTER — Encounter: Payer: Self-pay | Admitting: Pharmacist

## 2020-07-10 ENCOUNTER — Telehealth: Payer: Self-pay | Admitting: Pharmacist

## 2020-07-10 NOTE — Telephone Encounter (Signed)
Called pt again to follow up with Leqvio cholesterol shots. He did not read MyChart message that was sent 6 weeks ago. Unable to reach pt by phone again - Mobile # states "the number you dialed cannot be completed" and home # states number is no longer in service. He does not have any upcoming visits. Will send pt letter by mail asking him to call us if he would like to begin benefits investigation for Leqvio cholesterol injections.

## 2020-08-14 ENCOUNTER — Telehealth: Payer: Self-pay | Admitting: Cardiovascular Disease

## 2020-08-14 NOTE — Telephone Encounter (Signed)
   Pt c/o Shortness Of Breath: STAT if SOB developed within the last 24 hours or pt is noticeably SOB on the phone  1. Are you currently SOB (can you hear that pt is SOB on the phone)? no  2. How long have you been experiencing SOB? ~5 days   3. Are you SOB when sitting or when up moving around?   4. Are you currently experiencing any other symptoms? Unusual fatigue, no energy, higher BP over the weekend  Pt c/o BP issue: STAT if pt c/o blurred vision, one-sided weakness or slurred speech  1. What are your last 5 BP readings?  Patient states he has them saved on his machine  2. Are you having any other symptoms (ex. Dizziness, headache, blurred vision, passed out)? Blurred vision yesterday but has resolved   3. What is your BP issue? Pt states his BP was a little high over the weekend    Patient wanted to come in and be evaluated by Dr. Acie Fredrickson. He states some of the symptoms he had were consistent with symptoms he had before he had bypass surgery

## 2020-08-14 NOTE — Telephone Encounter (Signed)
RN returned call to patient regarding elevated blood pressure and SOB. Patient states he noticed an increased blood pressure on Sunday 4/17 when he had minimal blurred vision, but symptoms resolved once he sat down for a little bit and increased hydration. Patient checked his blood pressure and it was 163/56 and his heart rate was 57.  Patient reports his blood pressure is 138/87 and HR is 57 this morning with no symptoms currently. Patient noticed minimal shortness of breath when walking upstairs and back down. RN spoke to Dr.Nahser who recommend patient increase fluids and monitor blood pressure and symptoms. RN explained to patient and made appointment for Wednesday 4/20 at 1:40pm. RN advised patient to contact the office if symptoms worse. Patient verbalized understanding.

## 2020-08-16 ENCOUNTER — Ambulatory Visit (INDEPENDENT_AMBULATORY_CARE_PROVIDER_SITE_OTHER): Payer: 59 | Admitting: Cardiovascular Disease

## 2020-08-16 ENCOUNTER — Telehealth: Payer: Self-pay | Admitting: Pharmacist

## 2020-08-16 ENCOUNTER — Other Ambulatory Visit: Payer: Self-pay

## 2020-08-16 ENCOUNTER — Encounter: Payer: Self-pay | Admitting: Cardiovascular Disease

## 2020-08-16 DIAGNOSIS — R072 Precordial pain: Secondary | ICD-10-CM | POA: Diagnosis not present

## 2020-08-16 MED ORDER — NITROGLYCERIN 0.4 MG SL SUBL: 0.4000 mg | SUBLINGUAL_TABLET | SUBLINGUAL | 3 refills | Status: DC | PRN

## 2020-08-16 NOTE — Patient Instructions (Signed)
Medication Instructions:  Your physician recommends that you continue on your current medications as directed. Please refer to the Current Medication list given to you today.  *If you need a refill on your cardiac medications before your next appointment, please call your pharmacy*   Lab Work: none If you have labs (blood work) drawn today and your tests are completely normal, you will receive your results only by: Marland Kitchen MyChart Message (if you have MyChart) OR . A paper copy in the mail If you have any lab test that is abnormal or we need to change your treatment, we will call you to review the results.   Testing/Procedures: Your physician has requested that you have an echocardiogram. Echocardiography is a painless test that uses sound waves to create images of your heart. It provides your doctor with information about the size and shape of your heart and how well your heart's chambers and valves are working. This procedure takes approximately one hour. There are no restrictions for this procedure.  Your physician has requested that you have en exercise stress myoview. For further information please visit [email or web address]. Please follow instruction sheet, as given.     Follow-Up: At [facility], you and your health needs are our priority.  As part of our continuing mission to provide you with exceptional heart care, we have created designated Provider Care Teams.  These Care Teams include your primary Cardiologist (physician) and Advanced Practice Providers (APPs -  Physician Assistants and Nurse Practitioners) who all work together to provide you with the care you need, when you need it.  Your next appointment:   3 month(s)  The format for your next appointment:   In Person  Provider:   Mertie Moores, MD

## 2020-08-16 NOTE — Telephone Encounter (Signed)
Pt signed Leqvio enrollment form before leaving the office today. Will submit to portal to see if Marion Downer is covered by his insurance at an affordable rate. He has reportedly been intolerant the below medications (myalgias with all):  Rosuvastatin 10mg  every other day Simvastatin Atorvastatin Pravastatin Livalo Fenofibrate Zetia 10mg  daily Repatha 140mg  Q2W Praluent 75mg  Q2W Nexletol 180mg  daily Vascepa 2g BID

## 2020-08-16 NOTE — Progress Notes (Signed)
Cardiology Office Note   Date:  08/16/2020   ID:  Kenneth Vance, Kenneth Vance 03-May-1955, MRN [id number]  PCP:  Gaynelle Arabian, MD  Cardiologist:   Mertie Moores, MD   No chief complaint on file.  Problem list 1. Coronary artery disease-status post coronary artery bypass grafting-December 2 , 2016  2. Hyperlipidemia   Previous notes:   Kenneth Vance is a 65 y.o. male who presents for  CAD / CABG Doing much better after CABG Has lost 10 lbs. Walks 3-5 miles a day .   No angina   Has been on a very low fat diet. Has tried numerous statins and did not tolerate it at all. Have prescribed crestor  - has not started it yet.    Has tolerated fenofobrate fairly well.   Aug 30, 2015:  Doing great - except he is not tolerating Crestor  Has tried it twice - severe muscle  Has tried zocor, lipitor, crestor, pravachol, Livalo   August 16, 2020:   Kenneth Vance is seen today for a work in visit.  He has a history of coronary artery disease and coronary artery bypass grafting. He has been seen in our lipid clinic  I last saw him in May, 2017 ( 5 years ago) ,  He was seen by Ellen Henri, [place] in May , 2020.  Complaining of CP Cath showed progressive diffuse disease Medical therapy was recommended.    He called the office several days ago complaining of elevated blood pressure and increasing shortness of breath.  BP is normal today and was yesterday  Has had more fatigue recently  No short of breath like he did in 2016 upon presentation   Has had some chest pressure ( while lying down) resolved when he sat up  Is feeling much better for the past several days   He walked across our parking lot fast today while coming in and did not have any issues  Has lost quite a bit of weight     Past Medical History:  Diagnosis Date  . BCC (basal cell carcinoma), ear   . CAD in native artery 03/29/15   severe CAD   . History of kidney stones   . Hyperlipidemia   . Pneumonia    as infant  . S/P CABG x  4 03/31/15    Past Surgical History:  Procedure Laterality Date  . CARDIAC CATHETERIZATION N/A 03/29/2015   Procedure: Left Heart Cath and Coronary Angiography;  Surgeon: Kenneth Booze, MD;  Location: [facility];  Service: Cardiovascular;  Laterality: N/A;  . CATARACT EXTRACTION Left   . CORONARY ARTERY BYPASS GRAFT N/A 03/31/2015   Procedure: CORONARY ARTERY BYPASS GRAFTING (CABG) X 4 UTILIZING THE LEFT INTERNAL MAMMARY ARTERY  AND RIGHT SAPHENEOUS VEINS .;  Surgeon: Kenneth Pollack, MD;  Location: [facility];  Service: Open Heart Surgery;  Laterality: N/A;  . EXTRACORPOREAL SHOCK WAVE LITHOTRIPSY Right 01/30/2017   Procedure: RIGHT EXTRACORPOREAL SHOCK WAVE LITHOTRIPSY (ESWL);  Surgeon: Kathie Rhodes, MD;  [facility];  Service: Urology;  Laterality: Right;  . HYDROCELE EXCISION    . LEFT HEART CATH AND CORS/GRAFTS ANGIOGRAPHY N/A 09/29/2018   Procedure: LEFT HEART CATH AND CORS/GRAFTS ANGIOGRAPHY;  Surgeon: Kenneth Sine, MD;  Location: [facility];  Service: Cardiovascular;  Laterality: N/A;  . MANDIBLE SURGERY    . TEE WITHOUT CARDIOVERSION N/A 03/31/2015   Procedure: TRANSESOPHAGEAL ECHOCARDIOGRAM (TEE);  Surgeon: Kenneth Pollack, MD;  Location: [facility]  OR;  Service: Open Heart Surgery;  Laterality: N/A;     Current Outpatient Medications  Medication Sig Dispense Refill  . aspirin 81 MG EC tablet Take 1 tablet (81 mg total) by mouth daily.    . nitroGLYCERIN (NITROSTAT) 0.4 MG SL tablet Place 1 tablet (0.4 mg total) under the tongue every 5 (five) minutes as needed for chest pain. 25 tablet 3   No current facility-administered medications for this visit.    Allergies:   Peanut-containing drug products, Crestor [rosuvastatin calcium], Repatha [evolocumab], Zetia [ezetimibe], Fenofibrate, Lipitor [atorvastatin], Livalo [pitavastatin], Praluent [alirocumab], and Zocor [simvastatin]    Social History:  The patient  reports that he has never smoked. He has never used  smokeless tobacco. He reports current alcohol use. He reports that he does not use drugs.   Family History:  The patient's family history includes Hyperlipidemia in his mother; Pulmonary fibrosis in his father.    ROS:  Please see the history of present illness.      Physical Exam: Blood pressure 114/68, pulse (!) 56, height 6\' 1"  (1.854 m), weight 194 lb 12.8 oz (88.4 kg), SpO2 98 %.  GEN:  Well nourished, well developed in no acute distress HEENT: Normal NECK: No JVD; No carotid bruits LYMPHATICS: No lymphadenopathy CARDIAC: RRR , no murmurs, rubs, gallops RESPIRATORY:  Clear to auscultation without rales, wheezing or rhonchi  ABDOMEN: Soft, non-tender, non-distended MUSCULOSKELETAL:  No edema; No deformity  SKIN: Warm and dry NEUROLOGIC:  Alert and oriented x 3    ECG August 16, 2020: Sinus brady at 26.  Previous Inf. MI , RBBB    Recent Labs: 12/01/2019: ALT 11    Lipid Panel    Component Value Date/Time   CHOL 154 12/01/2019 0810   TRIG 82 12/01/2019 0810   HDL 43 12/01/2019 0810   CHOLHDL 3.6 12/01/2019 0810   CHOLHDL 4.5 10/04/2015 0812   VLDL 31 (H) 10/04/2015 0812   LDLCALC 95 12/01/2019 0810      Wt Readings from Last 3 Encounters:  08/16/20 194 lb 12.8 oz (88.4 kg)  03/16/19 209 lb (94.8 kg)  09/29/18 205 lb (93 kg)      Other studies Reviewed: Additional studies/ records that were reviewed today include: . Review of the above records demonstrates:    ASSESSMENT AND PLAN:  1.  Coronary artery disease: [place] is been having some episodes of chest discomfort more shortness of breath recently.  He has been a little bit better for the past couple of days but his symptoms are concerning.  I like to order a stress Myoview study.  I would also like to get an echocardiogram for further evaluation of his left ventricular function.  I will see him again in 3 months for follow-up visit.  2. Hyperlipidemia:  .  He has been working with the lipid clinic.  They  are considering Inclisiran at this point.   Current medicines are reviewed at length with the patient today.  The patient does not have concerns regarding medicines.  The following changes have been made:  no change  Labs/ tests ordered today include:   Orders Placed This Encounter  Procedures  . MYOCARDIAL PERFUSION IMAGING  . EKG 12-Lead  . ECHOCARDIOGRAM COMPLETE     Disposition:   FU with me in 3 months     Mertie Moores, MD  08/16/2020 2:33 PM    [address], [place], [place]  [address] Phone: [phone]; Fax: (  [phone]   [facility]  [address] [place], [place]  [address] [phone]   Fax [phone]

## 2020-08-17 NOTE — Telephone Encounter (Addendum)
Received response from patient's insurance. Marion Downer is covered at 50% copay until the patient reaches $8,700 out of pocket maximum. He has currently met $0 of this deductible. [facility] unfortunately charges the pt 2x what it costs to acquire the drug, so the [place] card still will not make the med affordable. He is unfortunately unable to start on Leqvio due to cost. He does not qualify for ORION-10 trial and has been intolerant to 5 statins, both PCSK9i, Zetia, Nexletol, Vascepa, and fenofibrate. Do not have any other lipid lowering options at this time given pt's reported intolerances to all available lipid lowering therapies.  Left message for pt to advise him of the above.

## 2020-08-21 NOTE — Telephone Encounter (Addendum)
Pt is aware, he still wishes to try Leqvio if possible. He's going to do research to see if we can send rx to a specialty pharmacy instead which would avoid [facility]'s upcharge and potentially make the med more affordable. I'll work on submitting [place].

## 2020-08-22 NOTE — Telephone Encounter (Signed)
[place] to initate PA at [phone]. This has been submitted, turnaround time is anticipated to be 24-72 hours. Reference # [id number].

## 2020-08-23 ENCOUNTER — Telehealth (HOSPITAL_COMMUNITY): Payer: Self-pay

## 2020-08-23 NOTE — Telephone Encounter (Signed)
Detailed instructions left on the patient's answering machine. Asked to call back with any questions. S.Aydan Phoenix EMTP 

## 2020-08-24 ENCOUNTER — Telehealth: Payer: Self-pay | Admitting: Pharmacist

## 2020-08-24 NOTE — Telephone Encounter (Signed)
Returned call to pt. He wondered if his PCP could administer Leqvio. Advised pt I'm still waiting to hear back on PA from his insurance first. Marena Chancy if his PCP would do buy and bill process for Leqvio. Will plan to reach out to specialty [facility] pharmacy if PA is approved to see if they can order the med for pt.

## 2020-08-24 NOTE — Telephone Encounter (Signed)
Patient called requesting to speak with [facility]. Advised she was in with a patient. He requested for her to give him a call back at [phone].

## 2020-08-29 ENCOUNTER — Ambulatory Visit (HOSPITAL_COMMUNITY): Payer: 59 | Attending: Internal Medicine

## 2020-08-29 ENCOUNTER — Other Ambulatory Visit: Payer: Self-pay

## 2020-08-29 DIAGNOSIS — R072 Precordial pain: Secondary | ICD-10-CM | POA: Diagnosis present

## 2020-08-29 LAB — MYOCARDIAL PERFUSION IMAGING
Estimated workload: 10.1 METS
Exercise duration (min): 9 min
Exercise duration (sec): 0 s
LV dias vol: 117 mL (ref 62–150)
LV sys vol: 50 mL
MPHR: 157 {beats}/min
Peak HR: 133 {beats}/min
Percent HR: 85 %
Rest HR: 76 {beats}/min
SDS: 1
SRS: 0
SSS: 1
TID: 0.94

## 2020-08-29 MED ORDER — TECHNETIUM TC 99M TETROFOSMIN IV KIT
27.6000 | PACK | Freq: Once | INTRAVENOUS | Status: AC | PRN
  Administered 2020-08-29: 27.6 via INTRAVENOUS
  Filled 2020-08-29: qty 28

## 2020-08-29 MED ORDER — TECHNETIUM TC 99M TETROFOSMIN IV KIT
8.3000 | PACK | Freq: Once | INTRAVENOUS | Status: AC | PRN
  Administered 2020-08-29: 8.3 via INTRAVENOUS
  Filled 2020-08-29: qty 9

## 2020-09-01 NOTE — Telephone Encounter (Signed)
Called insurance for follow up on PA status. They stated they canceled PA because it has to be processed through medical benefits, not pharmacy benefits. They never sent [place] any follow up communication with this info. If rx is processed through medical benefits, pt will need to have injection done at [facility] which will be unaffordable since [facility] is upcharging the pt by over 50% bringing the med cost to $6400. Hope was to have rx processed at specialty pharmacy and use copay card which pt cannot do if his insurance will not approve PA through pharmacy benefits. Reached back out to [place] portal who did initial benefit investigation and they stated that unfortunately "specialty pharmacy dispense is not an option, the provider must buy and bill or refer to alternative injection center."  Called pt to let him know that we are unable to start him on Leqvio through his insurance due to unaffordable cost.  He had not qualified for ORION 4 trial since he needs hx of MI, stroke, or lower extremity revascularization. However, since I last spoke with pt, he had a stress test 08/29/20. Dr Acie Fredrickson sent pt MyChart message 08/30/20 stating: "Your stress test shows previous small heart attack ( scar) that was likely due to your original blockages from 2016."  Will forward back to research in hopes that this will qualify pt for ORION 4 trial since there is now documentation that pt has had a prior MI.

## 2020-09-15 ENCOUNTER — Other Ambulatory Visit (HOSPITAL_COMMUNITY): Payer: 59

## 2020-10-13 ENCOUNTER — Ambulatory Visit (HOSPITAL_COMMUNITY): Payer: 59 | Attending: Cardiology

## 2020-10-13 ENCOUNTER — Other Ambulatory Visit: Payer: Self-pay

## 2020-10-13 DIAGNOSIS — R072 Precordial pain: Secondary | ICD-10-CM | POA: Diagnosis not present

## 2020-10-13 LAB — ECHOCARDIOGRAM COMPLETE
Area-P 1/2: 3.48 cm2
S' Lateral: 3.4 cm

## 2020-11-14 ENCOUNTER — Encounter: Payer: Self-pay | Admitting: Cardiovascular Disease

## 2020-11-14 NOTE — Progress Notes (Addendum)
Cardiology Office Note   Date:  11/15/2020   ID:  Kenneth Vance, Kenneth Vance 03-22-1956, MRN [id number]  PCP:  Kenneth Arabian, MD  Cardiologist:   Kenneth Moores, MD   Chief Complaint  Patient presents with   Coronary Artery Disease   Hyperlipidemia   Problem list 1. Coronary artery disease-status post coronary artery bypass grafting-December 2 , 2016 Myoview study from May, 2022 shows a fixed inferior scar suggesting a prior inferior MI   2. Hyperlipidemia   Previous notes:   Kenneth Vance is a 65 y.o. male who presents for  CAD / CABG Doing much better after CABG Has lost 10 lbs. Walks 3-5 miles a day .   No angina   Has been on a very low fat diet. Has tried numerous statins and did not tolerate it at all. Have prescribed crestor  - has not started it yet.    Has tolerated fenofobrate fairly well.   Aug 30, 2015:  Doing great - except he is not tolerating Crestor  Has tried it twice - severe muscle  Has tried zocor, lipitor, crestor, pravachol, Livalo   August 16, 2020:   Kenneth Vance is seen today for a work in visit.  He has a history of coronary artery disease and coronary artery bypass grafting. He has been seen in our lipid clinic  I last saw him in May, 2017 ( 5 years ago) ,  He was seen by Kenneth Vance, Kenneth Vance in May , 2020.  Complaining of CP Cath showed progressive diffuse disease Medical therapy was recommended.    He called the office several days ago complaining of elevated blood pressure and increasing shortness of breath.  BP is normal today and was yesterday  Has had more fatigue recently  No short of breath like he did in 2016 upon presentation   Has had some chest pressure ( while lying down) resolved when he sat up  Is feeling much better for the past several days   He walked across our parking lot fast today while coming in and did not have any issues  Has lost quite a bit of weight   November 15, 2020 Kenneth Vance is seen today for follow up for his CAD [name] showed an inferior scar  He is feeling better  No further chest pressure  Occasionally gets some indigestion with exercise  He will try a NTG for this "indigestion" like discomfort .   Past Medical History:  Diagnosis Date   BCC (basal cell carcinoma), ear    CAD in native artery 03/29/15   severe CAD    History of kidney stones    Hyperlipidemia    Pneumonia    as infant   S/P CABG x 4 03/31/15    Past Surgical History:  Procedure Laterality Date   CARDIAC CATHETERIZATION N/A 03/29/2015   Procedure: Left Heart Cath and Coronary Angiography;  Surgeon: Kenneth Booze, MD;  Location: [facility];  Service: Cardiovascular;  Laterality: N/A;   CATARACT EXTRACTION Left    CORONARY ARTERY BYPASS GRAFT N/A 03/31/2015   Procedure: CORONARY ARTERY BYPASS GRAFTING (CABG) X 4 UTILIZING THE LEFT INTERNAL MAMMARY ARTERY  AND RIGHT SAPHENEOUS VEINS .;  Surgeon: Kenneth Pollack, MD;  Location: [facility];  Service: Open Heart Surgery;  Laterality: N/A;   EXTRACORPOREAL SHOCK WAVE LITHOTRIPSY Right 01/30/2017   Procedure: RIGHT EXTRACORPOREAL SHOCK WAVE LITHOTRIPSY (ESWL);  Surgeon: Kenneth Rhodes, MD;  [facility];  Service: Urology;  Laterality: Right;   HYDROCELE EXCISION     LEFT HEART CATH AND CORS/GRAFTS ANGIOGRAPHY N/A 09/29/2018   Procedure: LEFT HEART CATH AND CORS/GRAFTS ANGIOGRAPHY;  Surgeon: Kenneth Sine, MD;  Location: [facility];  Service: Cardiovascular;  Laterality: N/A;   MANDIBLE SURGERY     TEE WITHOUT CARDIOVERSION N/A 03/31/2015   Procedure: TRANSESOPHAGEAL ECHOCARDIOGRAM (TEE);  Surgeon: Kenneth Pollack, MD;  Location: [facility];  Service: Open Heart Surgery;  Laterality: N/A;     Current Outpatient Medications  Medication Sig Dispense Refill   aspirin 81 MG EC tablet Take 1 tablet (81 mg total) by mouth daily.     nitroGLYCERIN (NITROSTAT) 0.4 MG SL tablet Place 1 tablet (0.4 mg total) under the tongue every 5 (five) minutes as needed for chest pain.  25 tablet 3   No current facility-administered medications for this visit.    Allergies:   Peanut-containing drug products, Crestor [rosuvastatin calcium], Repatha [evolocumab], Zetia [ezetimibe], Fenofibrate, Lipitor [atorvastatin], Livalo [pitavastatin], Praluent [alirocumab], and Zocor [simvastatin]    Social History:  The patient  reports that he has never smoked. He has never used smokeless tobacco. He reports current alcohol use. He reports that he does not use drugs.   Family History:  The patient's family history includes Hyperlipidemia in his mother; Pulmonary fibrosis in his father.    ROS:  Please see the history of present illness.      Physical Exam: Blood pressure 132/72, pulse (!) 56, height 6\' 1"  (1.854 m), weight 199 lb (90.3 kg), SpO2 98 %.  GEN:  Well nourished, well developed in no acute distress HEENT: Normal NECK: No JVD; No carotid bruits LYMPHATICS: No lymphadenopathy CARDIAC: RRR , no murmurs, rubs, gallops RESPIRATORY:  Clear to auscultation without rales, wheezing or rhonchi  ABDOMEN: Soft, non-tender, non-distended MUSCULOSKELETAL:  No edema; No deformity  SKIN: Warm and dry NEUROLOGIC:  Alert and oriented x 3    ECG     Recent Labs: 12/01/2019: ALT 11    Lipid Panel    Component Value Date/Time   CHOL 154 12/01/2019 0810   TRIG 82 12/01/2019 0810   HDL 43 12/01/2019 0810   CHOLHDL 3.6 12/01/2019 0810   CHOLHDL 4.5 10/04/2015 0812   VLDL 31 (H) 10/04/2015 0812   LDLCALC 95 12/01/2019 0810      Wt Readings from Last 3 Encounters:  11/15/20 199 lb (90.3 kg)  08/29/20 194 lb (88 kg)  08/16/20 194 lb 12.8 oz (88.4 kg)      Other studies Reviewed: Additional studies/ records that were reviewed today include: . Review of the above records demonstrates:    ASSESSMENT AND PLAN:  1.  Coronary artery disease:  Kenneth Vance is doing better than he was several months ago .  No further episodes of CP .  Does have some indigestion like  discomfort on occasion with exercise.  I've asked him to pay attention to this as this could be an ischemic equivalent.  I have asked him to try nitroglycerin to see if it resolves this indigestion-like chest pain.  It is possible that he may have occluded his RCA graft and is having recurrent angina.  His Myoview shows a fixed inferior scar c/w a previous inferior MI   I explained the fact that we would not necessarily going to do a heart catheterization for the possibility of an occluded graft without symptoms but if he does have progressive symptoms we would certainly consider heart catheterization.  2. Hyperlipidemia:  .  He is intolerant to statins.  He thinks that his best lipid results were when he was on Vascepa and Nexletol.  We will restart these medications.  Check labs in 3 months.   Current medicines are reviewed at length with the patient today.  The patient does not have concerns regarding medicines.  The following changes have been made:  no change  Labs/ tests ordered today include:   No orders of the defined types were placed in this encounter.    Disposition:   I will see him again in 6 months for follow-up office visit, sooner if needed.   Kenneth Moores, MD  11/15/2020 9:38 AM    [facility] [place], [place], [place]  [address] Phone: [phone]; Fax: [phone]

## 2020-11-15 ENCOUNTER — Encounter: Payer: Self-pay | Admitting: Cardiovascular Disease

## 2020-11-15 ENCOUNTER — Other Ambulatory Visit: Payer: Self-pay

## 2020-11-15 ENCOUNTER — Ambulatory Visit: Payer: 59 | Admitting: Cardiovascular Disease

## 2020-11-15 VITALS — BP 132/72 | HR 56 | Ht 73.0 in | Wt 199.0 lb

## 2020-11-15 DIAGNOSIS — I2510 Atherosclerotic heart disease of native coronary artery without angina pectoris: Secondary | ICD-10-CM

## 2020-11-15 DIAGNOSIS — G720 Drug-induced myopathy: Secondary | ICD-10-CM

## 2020-11-15 DIAGNOSIS — E785 Hyperlipidemia, unspecified: Secondary | ICD-10-CM

## 2020-11-15 DIAGNOSIS — T466X5A Adverse effect of antihyperlipidemic and antiarteriosclerotic drugs, initial encounter: Secondary | ICD-10-CM

## 2020-11-15 MED ORDER — ICOSAPENT ETHYL 1 G PO CAPS: 2.0000 g | ORAL_CAPSULE | Freq: Two times a day (BID) | ORAL | 3 refills | Status: DC

## 2020-11-15 MED ORDER — NITROGLYCERIN 0.4 MG SL SUBL: 0.4000 mg | SUBLINGUAL_TABLET | SUBLINGUAL | 3 refills | Status: DC | PRN

## 2020-11-15 MED ORDER — NEXLETOL 180 MG PO TABS: 180.0000 mg | ORAL_TABLET | Freq: Every day | ORAL | 3 refills | Status: DC

## 2020-11-15 NOTE — Patient Instructions (Signed)
Medication Instructions:  Your physician has recommended you make the following change in your medication: START Vascepa 2 grams twice daily START Nexletol 180 mg once daily  *If you need a refill on your cardiac medications before your next appointment, please call your pharmacy*   Lab Work: Your physician recommends that you return for lab work in: 3 months on Thursday Oct. 20 - you may come in anytime after 7:30 am You will need to FAST for this appointment - nothing to eat or drink after midnight the night before except water.  If you have labs (blood work) drawn today and your tests are completely normal, you will receive your results only by: [place] (if you have MyChart) OR A paper copy in the mail If you have any lab test that is abnormal or we need to change your treatment, we will call you to review the results.   Testing/Procedures: None Ordered   Follow-Up: At [facility], you and your health needs are our priority.  As part of our continuing mission to provide you with exceptional heart care, we have created designated Provider Care Teams.  These Care Teams include your primary Cardiologist (physician) and Advanced Practice Providers (APPs -  Physician Assistants and Nurse Practitioners) who all work together to provide you with the care you need, when you need it.   Your next appointment:   6 month(s) on Monday January 23 at 8:00 am  The format for your next appointment:   In Person  Provider:   You may see Mertie Moores, MD or one of the following Advanced Practice Providers on your designated Care Team:   Richardson Dopp, PA-C [place], [place]

## 2020-11-28 ENCOUNTER — Telehealth: Payer: Self-pay

## 2020-11-28 NOTE — Telephone Encounter (Signed)
**Note De-Identified Tresa Jolley Obfuscation** I started a Icsapent PA through covermymeds. Key: [id number]

## 2020-11-28 NOTE — Telephone Encounter (Signed)
**Note De-Identified Kenneth Vance Obfuscation** I started a Nexletol PA through covermymeds. Key: [id number]

## 2020-11-30 NOTE — Telephone Encounter (Signed)
**Note De-Identified [name] Obfuscation** Letter received Kenneth Vance fax from [place] stating that they have approved the pts Nexletol for coverage until 11/27/2021. PA Reference #: [id number]  I have notified [facility] of this approval.

## 2020-11-30 NOTE — Telephone Encounter (Signed)
**Note De-Identified Kenneth Vance Obfuscation** Letter received [name] fax from [place] stating that they have approved the pts Icosapent for coverage until 11/27/2021. PA Reference #: [id number]  I have notified [facility] of this approval.

## 2020-12-05 NOTE — Telephone Encounter (Signed)
Dr Acie Fredrickson - please see below messages. Hoping to get Kenneth Vance enrolled in [place] trial but he needs documentation of prior MI. The only place this is noted is in your 08/30/20 MyChart message to pt which doesn't count per research inclusion criteria. Is there any way you're able to document in an office visit note or update his problem list to include prior MI so that he would qualify for the trial? Thank you!

## 2020-12-21 ENCOUNTER — Other Ambulatory Visit: Payer: Self-pay

## 2020-12-21 DIAGNOSIS — Z006 Encounter for examination for normal comparison and control in clinical research program: Secondary | ICD-10-CM

## 2020-12-21 NOTE — Research (Signed)
ORION 4 Informed Consent   Subject Name: Kenneth Vance  Subject met inclusion and exclusion criteria.  The informed consent form, study requirements and expectations were reviewed with the subject and questions and concerns were addressed prior to the signing of the consent form.  The subject verbalized understanding of the trial requirements.  The subject agreed to participate in the ORION 4 trial and signed the informed consent at [place] on 12/21/20.  The informed consent was obtained prior to performance of any protocol-specific procedures for the subject.  A copy of the signed informed consent was given to the subject and a copy was placed in the subject's medical record.   Cherene Altes Maheen Cwikla   Protocol version 7.0 Consent version 3    ORION 4 SUBJECT ID# [id number]           DEMOGRAPHICS SEX $RemoveBeforeDE'[x]'zNYakpEZxRztmKU$    MALE           '[]'$   MALE  EHTINCITY $RemoveBe'[]'AjgVQrymJ$     HISPANIC OR LATINO $RemoveB'[x]'vupyZNYf$     NON-HISPANIC OR LATINO  RACE $Rem'[x]'QScw$     WHITE         '[]'$    ASIAN $Remo'[]'aLwlX$     BLACK OR AFRICAN           AMERICAN $RemoveB'[]'GftVtzVJ$     AMERICAN INDIAN OR         [place] NATIVE $RemoveBefor'[]'fcIWTWkxxIfK$     NATIVE [place] OR           OTHER PACIFIC ISLANDER $RemoveBeforeDE'[]'PkoXADHYwuFIQEB$     OTHER          OTHER   AGE 65  FUTURE RESEARCH $RemoveBeforeD'[]'VxawPrpsUKeKUo$      NO            '[x]'$     YES  Pharmacogenetics Research    '[]'$      NO            '[]'$     YES  BIOMARKER $RemoveBe'[]'juEopGCHB$      NO            '[]'$     YES    ORION 4 SUBJECT ID# [id number]   INCLUSION/EXCLUSION                                           INCLUSIONS                                YES      NO  SIGNED INFORMED CONSENT $RemoveBeforeDE'[x]'obEzuiGeSAiZNSG$'[]'$   MEN ? 40          WOMEN ? 55            AGE:73 $Remov'[x]'oEplJo$'[]'$   PRIOR MI, ISCHEMIC STROKE OR PERIPHERAL ARTERY DISEASE AS EVIDENT BY PRIOR LOWER EXTREMITY ARTERY REVASCULARIZATION OR AORTIC ANEURYSM REPAIR $RemoveBeforeD'[x]'XAJRhBTOTthPNG$'[]'$   TOTAL CHOLESTEROL ? $RemoveBefor'116mg'zmlOtlsLkKsj$ /dL       RESULT:207 $RemoveBef'[x]'GalWAKDDRW$'[]'$                                          EXCLUSIONS                 N/A $Re'[x]'boR$   ACUTE CORONARY SYNDROME OR STROKE LESS THAN 4 WKS PRIOR $RemoveB'[]'rpXOYAmD$'[]'$   CORONARY REVASCULARIZATION PROCEDURE PLANNED WITHIN  THE NEXT 6 MONTHS $Remove'[]'QgrBmaq$'[]'$   CHRONIC LIVER DISEASE []  []   CURRENT OR PLANNED RENAL DIALYSIS OR TRANSPLANT []  []   PREVIOUS EXPOSURE TO INCLISIRAN OR PARTICIPATION IN A RANDOMINIZED TRIAL OF INCLISIRAN []  []   PREVIOUS (WITHIN ABOUT 3 MONTHS), CURRENT OR PLANNED TREATMENT WITH MONOCLONAL ANTIBODY TARGETING PCSK9 []  []   KNOWN TO BE POORLY COMPLIANT WITH CLINIC VISITS OR PRESCRIBED MEDICATIONS []  []   MEDICAL HISTORY THAT MIGHT LIMIT ABILITY TO TAKE TRIAL TREATMENTST FOR THE DURATION OF THE STUDY, (EX. SEVERE RESPIRATORY DISEASE, CANCER OR EVIDENCE OF SPREAD WITHIN THE LAST 5 YEARS, HX OF ALCOHOL OR SUBSTANCE ABUSE) []  []   WOMEN OF CHILD-BEARING POTENTIAL, CURRENT PREGNANCY OR LACATION []  []   PARTICIPATION IN A CLINICAL TRIAL WITH AN UNLICENSED DRUG OR DEVICE []  []

## 2021-02-15 ENCOUNTER — Other Ambulatory Visit: Payer: 59

## 2021-02-26 ENCOUNTER — Telehealth: Payer: Self-pay | Admitting: Pharmacist

## 2021-02-26 NOTE — Telephone Encounter (Signed)
Unfortunately pt won't qualify for ORION trial due to technicality over MI diagnosis. His insurance does not cover Leqvio well. He is intolerant to 5 statins, both PCSK9i, Zetia, Nexletol, Vascepa, and fenofibrate.  Called pt, he is willing to retry Nexletol and Vascepa.

## 2021-02-26 NOTE — Telephone Encounter (Signed)
-----   Message from Chanda Busing, RN sent at 02/22/2021  3:49 PM EDT ----- Regarding: ORION 4 Per your request we brought this patient in to screen for ORION 4, Unfortunately after reviewing his chart in detail we are unable to randomize him for this trial due to no definitive diagnosis of MI. Although he did have a nuclear scan that showed a scar which could be suggestive of an inferior MI this is not enough to meet the criteria for the study. I have notified the patient of this and told him Jinny Blossom would reach back out to him. He has a few more questions. Thank you for this referral. If you have any further questions please don't hesitate to reach out to me or Dr Lia Foyer. Thanks, Ivin Booty

## 2021-05-20 ENCOUNTER — Encounter: Payer: Self-pay | Admitting: Cardiovascular Disease

## 2021-05-20 NOTE — Progress Notes (Signed)
Cardiology Office Note   Date:  05/21/2021   ID:  Kenneth Vance, Kenneth Vance Aug 18, 1955, MRN [id number]  PCP:  Gaynelle Arabian, MD  Cardiologist:   Mertie Moores, MD   Chief Complaint  Patient presents with   Coronary Artery Disease        Problem list 1. Coronary artery disease-status post coronary artery bypass grafting-December 2 , 2016 Myoview study from May, 2022 shows a fixed inferior scar suggesting a prior inferior MI   2. Hyperlipidemia   Previous notes:   Kenneth Vance is a 66 y.o. male who presents for  CAD / CABG Doing much better after CABG Has lost 10 lbs. Walks 3-5 miles a day .   No angina   Has been on a very low fat diet. Has tried numerous statins and did not tolerate it at all. Have prescribed crestor  - has not started it yet.    Has tolerated fenofobrate fairly well.   Aug 30, 2015:  Doing great - except he is not tolerating Crestor  Has tried it twice - severe muscle  Has tried zocor, lipitor, crestor, pravachol, Livalo   August 16, 2020:   Kenneth Vance is seen today for a work in visit.  He has a history of coronary artery disease and coronary artery bypass grafting. He has been seen in our lipid clinic  I last saw him in May, 2017 ( 5 years ago) ,  He was seen by Ellen Henri, [facility] in May , 2020.  Complaining of CP Cath showed progressive diffuse disease Medical therapy was recommended.    He called the office several days ago complaining of elevated blood pressure and increasing shortness of breath.  BP is normal today and was yesterday  Has had more fatigue recently  No short of breath like he did in 2016 upon presentation   Has had some chest pressure ( while lying down) resolved when he sat up  Is feeling much better for the past several days   He walked across our parking lot fast today while coming in and did not have any issues  Has lost quite a bit of weight   November 15, 2020 Kenneth Vance is seen today for follow up for his CAD [name] showed  an inferior scar  He is feeling better  No further chest pressure  Occasionally gets some indigestion with exercise  He will try a NTG for this "indigestion" like discomfort .  Jan. 23, 2023 Kenneth Vance is seen today for follow up of his CAD , HLD  His cp from last summer have largely resolved.  He remains very active at work  Has some chest pain with mental stress.    Past Medical History:  Diagnosis Date   BCC (basal cell carcinoma), ear    CAD in native artery 03/29/15   severe CAD    History of kidney stones    Hyperlipidemia    Pneumonia    as infant   S/P CABG x 4 03/31/15    Past Surgical History:  Procedure Laterality Date   CARDIAC CATHETERIZATION N/A 03/29/2015   Procedure: Left Heart Cath and Coronary Angiography;  Surgeon: Jettie Booze, MD;  Location: [facility];  Service: Cardiovascular;  Laterality: N/A;   CATARACT EXTRACTION Left    CORONARY ARTERY BYPASS GRAFT N/A 03/31/2015   Procedure: CORONARY ARTERY BYPASS GRAFTING (CABG) X 4 UTILIZING THE LEFT INTERNAL MAMMARY ARTERY  AND RIGHT SAPHENEOUS VEINS .;  Surgeon: Fernande Boyden  Cyndia Bent, MD;  Location: [facility];  Service: Open Heart Surgery;  Laterality: N/A;   EXTRACORPOREAL SHOCK WAVE LITHOTRIPSY Right 01/30/2017   Procedure: RIGHT EXTRACORPOREAL SHOCK WAVE LITHOTRIPSY (ESWL);  Surgeon: Kathie Rhodes, MD;  Location: [facility];  Service: Urology;  Laterality: Right;   HYDROCELE EXCISION     LEFT HEART CATH AND CORS/GRAFTS ANGIOGRAPHY N/A 09/29/2018   Procedure: LEFT HEART CATH AND CORS/GRAFTS ANGIOGRAPHY;  Surgeon: Troy Sine, MD;  Location: [facility];  Service: Cardiovascular;  Laterality: N/A;   MANDIBLE SURGERY     TEE WITHOUT CARDIOVERSION N/A 03/31/2015   Procedure: TRANSESOPHAGEAL ECHOCARDIOGRAM (TEE);  Surgeon: Gaye Pollack, MD;  Location: [facility];  Service: Open Heart Surgery;  Laterality: N/A;     Current Outpatient Medications  Medication Sig Dispense Refill   aspirin 81 MG EC tablet Take 1  tablet (81 mg total) by mouth daily.     icosapent Ethyl (VASCEPA) 1 g capsule Take 2 capsules (2 g total) by mouth 2 (two) times daily. 360 capsule 3   Bempedoic Acid (NEXLETOL) 180 MG TABS Take 180 mg by mouth daily. (Patient not taking: Reported on 05/21/2021) 90 tablet 3   nitroGLYCERIN (NITROSTAT) 0.4 MG SL tablet Place 1 tablet (0.4 mg total) under the tongue every 5 (five) minutes as needed for chest pain. 25 tablet 3   No current facility-administered medications for this visit.    Allergies:   Peanut-containing drug products, Crestor [rosuvastatin calcium], Repatha [evolocumab], Zetia [ezetimibe], Fenofibrate, Lipitor [atorvastatin], Livalo [pitavastatin], Praluent [alirocumab], and Zocor [simvastatin]    Social History:  The patient  reports that he has never smoked. He has never used smokeless tobacco. He reports current alcohol use. He reports that he does not use drugs.   Family History:  The patient's family history includes Hyperlipidemia in his mother; Pulmonary fibrosis in his father.    ROS:  Please see the history of present illness.    Physical Exam: Blood pressure (!) 158/82, pulse (!) 58, height 6\' 1"  (1.854 m), weight 198 lb (89.8 kg), SpO2 98 %.  GEN:  Well nourished, well developed in no acute distress HEENT: Normal NECK: No JVD; No carotid bruits LYMPHATICS: No lymphadenopathy CARDIAC: RRR , no murmurs, rubs, gallops RESPIRATORY:  Clear to auscultation without rales, wheezing or rhonchi  ABDOMEN: Soft, non-tender, non-distended MUSCULOSKELETAL:  No edema; No deformity  SKIN: Warm and dry NEUROLOGIC:  Alert and oriented x 3     ECG     Recent Labs: No results found for requested labs within last 8760 hours.    Lipid Panel    Component Value Date/Time   CHOL 154 12/01/2019 0810   TRIG 82 12/01/2019 0810   HDL 43 12/01/2019 0810   CHOLHDL 3.6 12/01/2019 0810   CHOLHDL 4.5 10/04/2015 0812   VLDL 31 (H) 10/04/2015 0812   LDLCALC 95 12/01/2019  0810      Wt Readings from Last 3 Encounters:  05/21/21 198 lb (89.8 kg)  11/15/20 199 lb (90.3 kg)  08/29/20 194 lb (88 kg)      Other studies Reviewed: Additional studies/ records that were reviewed today include: . Review of the above records demonstrates:    ASSESSMENT AND PLAN:  1.  Coronary artery disease:   no recent episodes of angina   His Myoview shows a fixed inferior scar c/w a previous inferior MI  He exercises regularly ,  Has atypical CP with stress ( mental stress, anxiety )  2. Hyperlipidemia:  .  Is intolerant to repatha, pralulent Will see if his new insurance company will cover inclisiran    Cont current meds for now    Current medicines are reviewed at length with the patient today.  The patient does not have concerns regarding medicines.  The following changes have been made:  no change  Labs/ tests ordered today include:   No orders of the defined types were placed in this encounter.     Disposition:     Mertie Moores, MD  05/21/2021 8:36 AM    [address], [place], [place]  [address] Phone: [phone]; Fax: [phone]

## 2021-05-21 ENCOUNTER — Encounter: Payer: Self-pay | Admitting: Cardiovascular Disease

## 2021-05-21 ENCOUNTER — Ambulatory Visit: Payer: BC Managed Care – PPO | Admitting: Cardiovascular Disease

## 2021-05-21 ENCOUNTER — Other Ambulatory Visit: Payer: Self-pay

## 2021-05-21 VITALS — BP 158/82 | HR 58 | Ht 73.0 in | Wt 198.0 lb

## 2021-05-21 DIAGNOSIS — G720 Drug-induced myopathy: Secondary | ICD-10-CM

## 2021-05-21 DIAGNOSIS — T466X5A Adverse effect of antihyperlipidemic and antiarteriosclerotic drugs, initial encounter: Secondary | ICD-10-CM

## 2021-05-21 DIAGNOSIS — I2510 Atherosclerotic heart disease of native coronary artery without angina pectoris: Secondary | ICD-10-CM | POA: Diagnosis not present

## 2021-05-21 DIAGNOSIS — E782 Mixed hyperlipidemia: Secondary | ICD-10-CM | POA: Diagnosis not present

## 2021-05-21 DIAGNOSIS — E785 Hyperlipidemia, unspecified: Secondary | ICD-10-CM | POA: Diagnosis not present

## 2021-05-21 LAB — BASIC METABOLIC PANEL
BUN/Creatinine Ratio: 14 (ref 10–24)
BUN: 15 mg/dL (ref 8–27)
CO2: 25 mmol/L (ref 20–29)
Calcium: 9.5 mg/dL (ref 8.6–10.2)
Chloride: 101 mmol/L (ref 96–106)
Creatinine, Ser: 1.11 mg/dL (ref 0.76–1.27)
Glucose: 95 mg/dL (ref 70–99)
Potassium: 4.5 mmol/L (ref 3.5–5.2)
Sodium: 139 mmol/L (ref 134–144)
eGFR: 74 mL/min/{1.73_m2} (ref >59)

## 2021-05-21 LAB — HEPATIC FUNCTION PANEL
ALT: 11 IU/L (ref 0–44)
AST: 15 IU/L (ref 0–40)
Albumin: 4.6 g/dL (ref 3.8–4.8)
Alkaline Phosphatase: 63 IU/L (ref 44–121)
Bilirubin Total: 0.4 mg/dL (ref 0.0–1.2)
Bilirubin, Direct: 0.1 mg/dL (ref 0.00–0.40)
Total Protein: 6.6 g/dL (ref 6.0–8.5)

## 2021-05-21 LAB — LIPID PANEL
Chol/HDL Ratio: 4.3 ratio (ref 0.0–5.0)
Cholesterol, Total: 213 mg/dL — ABNORMAL HIGH (ref 100–199)
HDL: 49 mg/dL (ref >39)
LDL Chol Calc (NIH): 135 mg/dL — ABNORMAL HIGH (ref 0–99)
Triglycerides: 165 mg/dL — ABNORMAL HIGH (ref 0–149)
VLDL Cholesterol Cal: 29 mg/dL (ref 5–40)

## 2021-05-21 NOTE — Patient Instructions (Addendum)
Medication Instructions:  Your physician recommends that you continue on your current medications as directed. Please refer to the Current Medication list given to you today.  *If you need a refill on your cardiac medications before your next appointment, please call your pharmacy*   Lab Work: None ordered today, but are due for LIPID, BMET, & LFT (ordered previously)   If you have labs (blood work) drawn today and your tests are completely normal, you will receive your results only by: [place] (if you have MyChart) OR A paper copy in the mail If you have any lab test that is abnormal or we need to change your treatment, we will call you to review the results.   Testing/Procedures: None ordered   Follow-Up: At [facility], you and your health needs are our priority.  As part of our continuing mission to provide you with exceptional heart care, we have created designated Provider Care Teams.  These Care Teams include your primary Cardiologist (physician) and Advanced Practice Providers (APPs -  Physician Assistants and Nurse Practitioners) who all work together to provide you with the care you need, when you need it.  We recommend signing up for the patient portal called "MyChart".  Sign up information is provided on this After Visit Summary.  MyChart is used to connect with patients for Virtual Visits (Telemedicine).  Patients are able to view lab/test results, encounter notes, upcoming appointments, etc.  Non-urgent messages can be sent to your provider as well.   To learn more about what you can do with MyChart, go to [email or web address].    Your next appointment:   12 month(s)  The format for your next appointment:   In Person  Provider:   Mertie Moores, MD  or Robbie Lis, PA-C, Christen Bame, NP, or Richardson Dopp, PA-C         Other Instructions

## 2021-05-25 ENCOUNTER — Telehealth: Payer: Self-pay | Admitting: Pharmacist

## 2021-05-25 NOTE — Telephone Encounter (Signed)
Pt re-referred for consideration of Leqvio.  He has reportedly been intolerant the below medications (myalgias with all):   Rosuvastatin 10mg  every other day Simvastatin Atorvastatin Pravastatin Livalo Fenofibrate Zetia 10mg  daily Repatha 140mg  Q2W Praluent 75mg  Q2W Nexletol 180mg  daily Vascepa 2g BID  Leqvio was pursued last year but his insurance only covered 50% of the copay so med was cost prohibitive. He did not qualify for the ORION-4 trial. Pt now has new insurance, benefit investigation form was submitted to [employer] with the following response: "Leqvio will be covered at 100% copay and the patient will have a $0 out of pocket responsibility. Provider can buy and bill or refer to an alternate injection center." Leqvio requires prior authorization before coverage. This has been faxed today to [facility]. Will follow up with pt once determination has been made.

## 2021-06-05 NOTE — Telephone Encounter (Signed)
Leqvio prior authorization approved but only through 08/23/21 with the note that after that date, "outpatient hospital setting will not be approved."  Called his insurance, they do not allow buy and bill long term even though the [employer] benefit investigation form stated that buy and bill would be covered. Pt will need to use specialty pharmacy instead which annoyingly requires a completely new prior authorization just to change the place of service. This has been submitted.

## 2021-06-21 MED ORDER — LEQVIO 284 MG/1.5ML ~~LOC~~ SOSY: PREFILLED_SYRINGE | SUBCUTANEOUS | 3 refills | Status: DC

## 2021-06-21 NOTE — Telephone Encounter (Signed)
We never received any update from pt's insurance. Haleigh called this morning and apparently Marion Downer was approved through 06/04/22, our office was never made aware though. Pt's insurance mandates that he fill Leqvio at [place] with a specific NPI # for the [facility] location. Rx has been sent in. Will take them at least a few days to process it. Called pt to make him aware of update, no answer and VM mentioned a company but not pt's name specifically so VM was not left.

## 2021-06-21 NOTE — Addendum Note (Signed)
Addended by: Jeanee Fabre E on: 06/21/2021 11:38 AM   Modules accepted: Orders

## 2021-06-25 NOTE — Telephone Encounter (Signed)
Called specialty pharmacy for an update. They stated rx is rejecting under pt's pharmacy benefits. Advised them we already have an active prior authorization on file but it's likely with the pt's medical benefits. They need to reach out to pt to verify info as well as his insurance company, they do not accept this info on the phone from me. Called pt to make him aware to expect a call from [place], no answer.

## 2021-06-29 ENCOUNTER — Telehealth: Payer: Self-pay | Admitting: Pharmacist

## 2021-06-29 NOTE — Telephone Encounter (Signed)
Pt c/o medication issue: ? ?1. Name of Medication: ? inclisiran (LEQVIO) 284 MG/1.5ML SOSY injection ? ?2. How are you currently taking this medication (dosage and times per day)? Not currently taking  ? ?3. Are you having a reaction (difficulty breathing--STAT)? No  ? ?4. What is your medication issue? Patient is calling stating his pharmacy advised him this medication has been approved. Please advise.  ?

## 2021-06-29 NOTE — Telephone Encounter (Signed)
Have tried to reach pt a few times about Leqvio approval but he has not answered. Rx was sent to specialty pharmacy last week for processing which was taking them a while. Will call them for status update on shipping out med to our office. ? ?Spoke with pharmacy, pt provided them with consent to ship, copay is $60 per injection. I activated another copay card and provided them with info:  ? ?BIN: [id number] ?PCN: [id number] ?GRP: [id number] ?ID: [id number] ? ?They will process this and mail rx to our office for next Wednesday 3/8, confirmed address. Quantity of 2 syringes will be sent. Scheduled pt injection appt on 3/10 with PharmD. ?

## 2021-06-29 NOTE — Telephone Encounter (Signed)
Specialty pharmacy is shipping out 2 Leqvio injections to [facility], should be delivered on 3/8. $0 copay card info given to them, otherwise copay is $60 per injection which pt was ok with as well. Scheduled appt with me on 3/10 for injection. ?

## 2021-07-03 DIAGNOSIS — I2510 Atherosclerotic heart disease of native coronary artery without angina pectoris: Secondary | ICD-10-CM | POA: Diagnosis not present

## 2021-07-05 NOTE — Telephone Encounter (Signed)
Single injection of Leqvio received via [employer]. Called pharmacy to confirm as they advised they would be sending out 2 injections which is what the delivery description says as well. Looks like they did just send out 1 injection but something on the delivery order rounds up to 2? Not really clear but we do have an injection for pt's appt tomorrow and they stated to call them again mid-May to start processing the next shipment before his 3 month appt. ?

## 2021-07-06 ENCOUNTER — Other Ambulatory Visit (HOSPITAL_COMMUNITY): Payer: Self-pay

## 2021-07-06 ENCOUNTER — Other Ambulatory Visit: Payer: Self-pay

## 2021-07-06 ENCOUNTER — Other Ambulatory Visit: Payer: Self-pay | Admitting: Cardiovascular Disease

## 2021-07-06 ENCOUNTER — Ambulatory Visit: Payer: BC Managed Care – PPO | Admitting: Pharmacist

## 2021-07-06 DIAGNOSIS — T466X5D Adverse effect of antihyperlipidemic and antiarteriosclerotic drugs, subsequent encounter: Secondary | ICD-10-CM | POA: Diagnosis not present

## 2021-07-06 DIAGNOSIS — E782 Mixed hyperlipidemia: Secondary | ICD-10-CM | POA: Diagnosis not present

## 2021-07-06 DIAGNOSIS — G720 Drug-induced myopathy: Secondary | ICD-10-CM

## 2021-07-06 MED ORDER — NITROGLYCERIN 0.4 MG SL SUBL
0.4000 mg | SUBLINGUAL_TABLET | SUBLINGUAL | 3 refills | Status: DC | PRN
  Filled 2021-07-06: qty 25, 5d supply, fill #0

## 2021-07-06 NOTE — Progress Notes (Signed)
Patient ID: Kenneth Vance                 DOB: [birth year]                    MRN: [id number] ? ? ? ? ?HPI: ?Kenneth Vance is a 65 y.o. male patient referred to lipid clinic by Dr Acie Fredrickson. PMH is significant for CAD s/p CABG in 2016, myoview from 08/2020 showed fixed inferior scar suggesting prior MI, and HLD. Cath 09/2018 revealed native CAD with 51 to 50% distal left main stenosis; total occlusion of the LAD at the ostium, diffuse 70% stenosis in the OM1 branch of the left circumflex coronary artery, with an upward takeoff small OM 2 vessel with 90% ostial stenosis; and abnormal takeoff of the RCA with 70% proximal stenosis. He was also found to have mild LV dysfunction with EF estimated at 45 to less than 50%. Medical therapy was recommended. ? ?Pt presents today for first Leqvio injection. Prior authorization approved through medical benefits through 06/04/22 with requirement that he fill rx at specialty pharmacy. Insurance does not allow injections to be given at infusion center at [facility]. Rx is being filled at [facility] in [place]. Using $0 copay card. ? ?He is intolerant to multiple lipid lowering medications including: rosuvastatin, simvastatin, atorvastatin, pravastatin, Livalo, Zetia, Repatha, Praluent, and fenofibrate (myalgias with all). Most recently was taking Vascepa and Nexletol. Started experiencing joint and muscle pain. Stopped his Nexletol and sx resolved. Continues on Vascepa but has reduced his dose to 1g BID, had a gout flare and saw that Vascepa could contribute to this. No issues since decreasing his dose. He was previously referred for the ORION-4 trial but did not qualify. His insurance did not cover Leqvio well in 2022 but does now in 2023.  ? ?Current Medications: Vascepa 1g BID ? ?Intolerances:  ?Rosuvastatin '10mg'$  every other day - myalgias ?Simvastatin - myalgias ?Atorvastatin - myalgias ?Pravastatin - myalgias ?Livalo - myalgias ?Zetia - myalgias ?Fenofibrate -  myalgias ?Praluent - myalgias ?Repatha - myalgias ?Nexletol - myalgias and joint pain ? ?Risk Factors: premature ASCVD ? ?LDL goal: '55mg'$ /dL ? ?Diet: reduced sugar considerably. Eats fish and chicken for protein as well as nuts. ? ?Family History: Hyperlipidemia in his mother, pulmonary fibrosis and CABG x7 in his father. 4 uncles on his mother's side died in their 66s from heart disease. ? ?Social History: The patient  reports that he has never smoked. He has never used smokeless tobacco. He reports current alcohol use. He reports that he does not use drugs ? ?Labs: ?05/21/2021: TC 213, TG 165, HDL 49, LDL 135 (on Vascepa 2g daily) ?03/08/2019: TC 261, TG 239, HDL 43, LDL 173 (no LLT) ?05/14/2017: TC 275, TG 183, HDL 38, LDL 200 (no LLT)  ? ?Past Medical History:  ?Diagnosis Date  ? BCC (basal cell carcinoma), ear   ? CAD in native artery 03/29/15  ? severe CAD   ? History of kidney stones   ? Hyperlipidemia   ? Pneumonia   ? as infant  ? S/P CABG x 4 03/31/15  ? ? ?Current Outpatient Medications on File Prior to Visit  ?Medication Sig Dispense Refill  ? aspirin 81 MG EC tablet Take 1 tablet (81 mg total) by mouth daily.    ? Bempedoic Acid (NEXLETOL) 180 MG TABS Take 180 mg by mouth daily. (Patient not taking: Reported on 05/21/2021) 90 tablet 3  ? icosapent Ethyl (VASCEPA) 1  g capsule Take 2 capsules (2 g total) by mouth 2 (two) times daily. 360 capsule 3  ? inclisiran (LEQVIO) 284 MG/1.5ML SOSY injection Inject '284mg'$  subcutaneously at month 0 and 3, then every 6 months 1.5 mL 3  ? nitroGLYCERIN (NITROSTAT) 0.4 MG SL tablet Place 1 tablet (0.4 mg total) under the tongue every 5 (five) minutes as needed for chest pain. 25 tablet 3  ? ?No current facility-administered medications on file prior to visit.  ? ? ?Allergies  ?Allergen Reactions  ? Peanut-Containing Drug Products Shortness Of Breath and Swelling  ?  migraine  ? Crestor [Rosuvastatin Calcium] Other (See Comments)  ?  Myalgias  ? Repatha [Evolocumab] Other (See  Comments)  ?  Muscle pain   ? Zetia [Ezetimibe] Other (See Comments)  ?  Myalgias  ? Fenofibrate   ?  Muscle pain  ? Lipitor [Atorvastatin] Other (See Comments)  ?  myalgia  ? Livalo [Pitavastatin] Other (See Comments)  ?  myalgia  ? Praluent [Alirocumab]   ?  Muscle pain  ? Zocor [Simvastatin] Other (See Comments)  ?  myalgia  ? ? ?Assessment/Plan: ? ?1. Hyperlipidemia - LDL 135 above goal < 55 given premature ASCVD. He is intolerant to 5 statins, Zetia, Nexletol, Praluent, Repatha, and fenofibrate. First Leqvio SQ injection given today in office into the back of his left upper arm. 2nd injection scheduled for 3 months from now, with lab work a month later. He will also increase his Vascepa back up to 2g BID in a few weeks (delaying dose change to ensure he tolerates Leqvio well in setting of prior intolerances). ? ?Keiston Manley E. Mariame Rybolt, PharmD, BCACP, CPP ?[id number], [place], [place] [address] ?Phone: [phone]; Fax: [phone] ?07/06/2021 1:40 PM ? ? ? ?

## 2021-07-06 NOTE — Patient Instructions (Addendum)
Your LDL cholesterol is 135 and your LDL goal is < 55 ? ?Your next Leqvio injection is in 3 months on Thursday, June 8th at 8:30am ? ?Recheck fasting labs on Thursday, July 13th any time after 7:30am ? ?Increase your Vascepa back up to 2 capsules twice daily in a few weeks ? ?

## 2021-07-31 DIAGNOSIS — L570 Actinic keratosis: Secondary | ICD-10-CM | POA: Diagnosis not present

## 2021-07-31 DIAGNOSIS — L820 Inflamed seborrheic keratosis: Secondary | ICD-10-CM | POA: Diagnosis not present

## 2021-09-05 ENCOUNTER — Telehealth: Payer: Self-pay

## 2021-09-05 NOTE — Telephone Encounter (Signed)
June will be the next injection date per the prescription they have on file (he's getting his 2nd injection which is due 3 months after the first). Historically have had delays with specialty pharmacy shipping out med so hoping to get the ball rolling sooner in case of delays. ?

## 2021-09-05 NOTE — Telephone Encounter (Signed)
Called [facility] to check status of leqvio injection shipment and they stated that they received it march the 8th for the 1st injection. They stated that they will reach out to schedule shipment today. They didn't have from insurance when the next refill date was. Routing to megan supple to make her aware. ?

## 2021-09-05 NOTE — Telephone Encounter (Signed)
-----   Message from Leeroy Bock, [place] sent at 09/05/2021  8:45 AM EDT ----- ?Are you able to call specialty pharmacy to ask them to start processing pt's next Leqvio injection please? They already have active rx on file and prescription should be shipped to [facility] office for in-office administration. ? ?

## 2021-09-14 NOTE — Telephone Encounter (Addendum)
Received call from [facility], they will deliver Leqvio next Tuesday 5/23. $0 copay, pt aware. Will call him to coordinate injection date once med is received. His first injection was given on 3/10 so next one is not due until ~6/10.

## 2021-09-17 DIAGNOSIS — I2510 Atherosclerotic heart disease of native coronary artery without angina pectoris: Secondary | ICD-10-CM | POA: Diagnosis not present

## 2021-09-18 NOTE — Telephone Encounter (Addendum)
Leqvio shipment received. F/u injection scheduled on 6/8 already.

## 2021-10-04 ENCOUNTER — Ambulatory Visit: Payer: BC Managed Care – PPO | Admitting: Pharmacist

## 2021-10-04 DIAGNOSIS — G720 Drug-induced myopathy: Secondary | ICD-10-CM | POA: Diagnosis not present

## 2021-10-04 DIAGNOSIS — E782 Mixed hyperlipidemia: Secondary | ICD-10-CM | POA: Diagnosis not present

## 2021-10-04 DIAGNOSIS — T466X5D Adverse effect of antihyperlipidemic and antiarteriosclerotic drugs, subsequent encounter: Secondary | ICD-10-CM

## 2021-10-04 MED ORDER — ICOSAPENT ETHYL 1 G PO CAPS
2.0000 g | ORAL_CAPSULE | Freq: Two times a day (BID) | ORAL | 3 refills | Status: DC
Start: 2021-10-04 — End: 2022-04-03

## 2021-10-04 NOTE — Patient Instructions (Addendum)
Your LDL cholesterol goal is < 55  Continue taking your Vascepa 2 capsules twice daily for your cholesterol, as well as the Leqvio injections which will now be every 6 months.  Keep your lab appointment for 11/08/21. You can come in any time after 7:30am for fasting lab work that day.  Your next Leqvio injection will be due in early December.

## 2021-10-04 NOTE — Progress Notes (Signed)
Patient ID: Kenneth Vance                 DOB: 1956/03/12                    MRN: [id number]     HPI: Kenneth Vance is a 66 y.o. male patient referred to lipid clinic by Dr Acie Fredrickson. PMH is significant for CAD s/p CABG in 2016, myoview from 08/2020 showed fixed inferior scar suggesting prior MI, and HLD. Cath 09/2018 revealed native CAD with 108 to 50% distal left main stenosis; total occlusion of the LAD at the ostium, diffuse 70% stenosis in the OM1 branch of the left circumflex coronary artery, with an upward takeoff small OM 2 vessel with 90% ostial stenosis; and abnormal takeoff of the RCA with 70% proximal stenosis. He was also found to have mild LV dysfunction with EF estimated at 45 to less than 50%. Medical therapy was recommended.  Prior authorization approved through medical benefits through 06/04/22 with requirement that he fill rx at specialty pharmacy. Insurance does not allow injections to be given at infusion center at [facility]. Rx is being filled at [facility] in [place]. Using $0 copay card.  He is intolerant to multiple lipid lowering medications including: rosuvastatin, simvastatin, atorvastatin, pravastatin, Livalo, Zetia, Repatha, Praluent, and fenofibrate (myalgias with all). Most recently was taking Vascepa and Nexletol. Started experiencing joint and muscle pain. Stopped his Nexletol and sx resolved. Continues on Vascepa but has reduced his dose to 1g BID, had a gout flare and saw that Vascepa could contribute to this. No issues since decreasing his dose. He was previously referred for the ORION-4 trial but did not qualify. His insurance did not cover Leqvio well in 2022 but does now in 2023.   Pt presented to clinic today in good spirits for 2nd Leqvio injection. Reported no issues tolerating Leqvio. Pt also increased Vascepa to 2g BID and is tolerating that well. Pt reported plans to change to Medicare at the beginning of 2024. Pt was advised to acquire the  Medicare supplemental plan to help reduce Leqvio copay costs.  Current Medications: Vascepa 2g BID, Leqvio 284 mg Q6M  Intolerances:  Rosuvastatin '10mg'$  every other day - myalgias Simvastatin - myalgias Atorvastatin - myalgias Pravastatin - myalgias Livalo - myalgias Zetia - myalgias Fenofibrate - myalgias Praluent - myalgias Repatha - myalgias Nexletol - myalgias and joint pain  Risk Factors: premature ASCVD  LDL goal: '55mg'$ /dL  Diet: reduced sugar considerably. Eats fish and chicken for protein as well as nuts.  Family History: Hyperlipidemia in his mother, pulmonary fibrosis and CABG x7 in his father. 4 uncles on his mother's side died in their 54s from heart disease.  Social History: The patient  reports that he has never smoked. He has never used smokeless tobacco. He reports current alcohol use. He reports that he does not use drugs  Labs: 05/21/2021: TC 213, TG 165, HDL 49, LDL 135 (on Vascepa 2g daily) 03/08/2019: TC 261, TG 239, HDL 43, LDL 173 (no LLT) 05/14/2017: TC 275, TG 183, HDL 38, LDL 200 (no LLT)   Past Medical History:  Diagnosis Date   BCC (basal cell carcinoma), ear    CAD in native artery 03/29/15   severe CAD    History of kidney stones    Hyperlipidemia    Pneumonia    as infant   S/P CABG x 4 03/31/15    Current Outpatient Medications on File Prior  to Visit  Medication Sig Dispense Refill   aspirin 81 MG EC tablet Take 1 tablet (81 mg total) by mouth daily.     icosapent Ethyl (VASCEPA) 1 g capsule Take 2 capsules (2 g total) by mouth 2 (two) times daily. 360 capsule 3   inclisiran (LEQVIO) 284 MG/1.5ML SOSY injection Inject '284mg'$  subcutaneously at month 0 and 3, then every 6 months 1.5 mL 3   nitroGLYCERIN (NITROSTAT) 0.4 MG SL tablet Place 1 tablet under the tongue every 5 minutes as needed for chest pain. 25 tablet 3   No current facility-administered medications on file prior to visit.    Allergies  Allergen Reactions   Peanut-Containing  Drug Products Shortness Of Breath and Swelling    migraine   Crestor [Rosuvastatin Calcium] Other (See Comments)    Myalgias   Repatha [Evolocumab] Other (See Comments)    Muscle pain    Zetia [Ezetimibe] Other (See Comments)    Myalgias   Fenofibrate     Muscle pain   Lipitor [Atorvastatin] Other (See Comments)    myalgia   Livalo [Pitavastatin] Other (See Comments)    myalgia   Nexletol [Bempedoic Acid]     Joint pain and myalgias   Praluent [Alirocumab]     Muscle pain   Pravastatin     myalgias   Zocor [Simvastatin] Other (See Comments)    myalgia    Assessment/Plan:  1. Hyperlipidemia - LDL 135 above goal < 55 given premature ASCVD. He is intolerant to 5 statins, Zetia, Nexletol, Praluent, Repatha, and fenofibrate. Second Leqvio injection given today in office into the back of his right upper arm. 3rd injection scheduled for 04/04/22. Will continue Vacsepa 2g BID. Will recheck lipid and hepatic panels on 11/08/21.  Patient seen with Reatha Harps, PharmD  Megan E. Supple, PharmD, BCACP, [place] [address], [place], [place] [address] Phone: [phone]; Fax: [phone] 10/04/2021 4:50 AM

## 2021-11-08 ENCOUNTER — Other Ambulatory Visit: Payer: BC Managed Care – PPO

## 2021-11-08 DIAGNOSIS — E782 Mixed hyperlipidemia: Secondary | ICD-10-CM

## 2021-11-08 LAB — HEPATIC FUNCTION PANEL
ALT: 10 IU/L (ref 0–44)
AST: 18 IU/L (ref 0–40)
Albumin: 4.6 g/dL (ref 3.9–4.9)
Alkaline Phosphatase: 61 IU/L (ref 44–121)
Bilirubin Total: 0.6 mg/dL (ref 0.0–1.2)
Bilirubin, Direct: 0.17 mg/dL (ref 0.00–0.40)
Total Protein: 6.8 g/dL (ref 6.0–8.5)

## 2021-11-08 LAB — LIPID PANEL
Chol/HDL Ratio: 2.8 ratio (ref 0.0–5.0)
Cholesterol, Total: 147 mg/dL (ref 100–199)
HDL: 52 mg/dL (ref >39)
LDL Chol Calc (NIH): 75 mg/dL (ref 0–99)
Triglycerides: 112 mg/dL (ref 0–149)
VLDL Cholesterol Cal: 20 mg/dL (ref 5–40)

## 2021-11-22 DIAGNOSIS — N486 Induration penis plastica: Secondary | ICD-10-CM | POA: Diagnosis not present

## 2021-11-22 DIAGNOSIS — N400 Enlarged prostate without lower urinary tract symptoms: Secondary | ICD-10-CM | POA: Diagnosis not present

## 2021-11-22 DIAGNOSIS — N5201 Erectile dysfunction due to arterial insufficiency: Secondary | ICD-10-CM | POA: Diagnosis not present

## 2021-11-22 DIAGNOSIS — N200 Calculus of kidney: Secondary | ICD-10-CM | POA: Diagnosis not present

## 2021-12-03 DIAGNOSIS — N200 Calculus of kidney: Secondary | ICD-10-CM | POA: Diagnosis not present

## 2021-12-03 DIAGNOSIS — K8020 Calculus of gallbladder without cholecystitis without obstruction: Secondary | ICD-10-CM | POA: Diagnosis not present

## 2021-12-26 DIAGNOSIS — N200 Calculus of kidney: Secondary | ICD-10-CM | POA: Diagnosis not present

## 2021-12-26 DIAGNOSIS — N5201 Erectile dysfunction due to arterial insufficiency: Secondary | ICD-10-CM | POA: Diagnosis not present

## 2022-03-18 ENCOUNTER — Other Ambulatory Visit: Payer: Self-pay | Admitting: Pharmacist

## 2022-03-18 MED ORDER — LEQVIO 284 MG/1.5ML ~~LOC~~ SOSY: PREFILLED_SYRINGE | SUBCUTANEOUS | 3 refills | Status: AC

## 2022-03-26 ENCOUNTER — Telehealth: Payer: Self-pay | Admitting: Pharmacist

## 2022-03-26 NOTE — Telephone Encounter (Signed)
Called pharmacy to make sure Kenneth Vance will be shipped to our office. They stated no issues running rx through his insurance, but they need to reach out to him to get consent to ship the med to our office and they will call us once this is done. Will follow up with them on Thursday if I have not heard back by then.  Pt will need PharmD appt moved from 12/7 to 12/6 due to availability if med is here by then.

## 2022-03-28 DIAGNOSIS — I2510 Atherosclerotic heart disease of native coronary artery without angina pectoris: Secondary | ICD-10-CM | POA: Diagnosis not present

## 2022-03-28 DIAGNOSIS — E785 Hyperlipidemia, unspecified: Secondary | ICD-10-CM | POA: Diagnosis not present

## 2022-03-28 NOTE — Telephone Encounter (Signed)
Called pharmacy again, they stated they reached out to pt a few times to obtain consent to ship med to our office but didn't reach him and left messages.  I called pt and left message as well. He will need to call into [facility] to provide verbal consent for pharmacy to ship med to our office.  Will also need PharmD appt for injection moved from 12/7 to 12/6 or 12/8 due to scheduling issue.

## 2022-04-01 NOTE — Telephone Encounter (Signed)
Appt moved to 12/6, Kenneth Vance has been shipped to our office.

## 2022-04-03 ENCOUNTER — Ambulatory Visit: Payer: BC Managed Care – PPO | Attending: Interventional Cardiology | Admitting: Pharmacist

## 2022-04-03 DIAGNOSIS — I2510 Atherosclerotic heart disease of native coronary artery without angina pectoris: Secondary | ICD-10-CM

## 2022-04-03 DIAGNOSIS — E782 Mixed hyperlipidemia: Secondary | ICD-10-CM

## 2022-04-03 DIAGNOSIS — G720 Drug-induced myopathy: Secondary | ICD-10-CM | POA: Diagnosis not present

## 2022-04-03 DIAGNOSIS — T466X5A Adverse effect of antihyperlipidemic and antiarteriosclerotic drugs, initial encounter: Secondary | ICD-10-CM

## 2022-04-03 NOTE — Patient Instructions (Signed)
We'll check your cholesterol today  Continue with Leqvio injections every 6 months

## 2022-04-03 NOTE — Progress Notes (Signed)
Patient ID: Kenneth Vance                 DOB: [birth year]                    MRN: [id number]     HPI: Kenneth Vance is a 66 y.o. male patient referred to lipid clinic by Dr Acie Fredrickson. PMH is significant for CAD s/p CABG in 2016, myoview from 08/2020 showed fixed inferior scar suggesting prior MI, and HLD. Cath 09/2018 revealed native CAD with 90 to 50% distal left main stenosis; total occlusion of the LAD at the ostium, diffuse 70% stenosis in the OM1 branch of the left circumflex coronary artery, with an upward takeoff small OM 2 vessel with 90% ostial stenosis; and abnormal takeoff of the RCA with 70% proximal stenosis. He was also found to have mild LV dysfunction with EF estimated at 45 to less than 50%. Medical therapy was recommended.  Prior authorization approved through medical benefits through 06/04/22 with requirement that he fill rx at specialty pharmacy. Insurance does not allow injections to be given at infusion center at [facility]. Rx is being filled at [facility] in [place]. Using $0 copay card.  He is intolerant to multiple lipid lowering medications including: rosuvastatin, simvastatin, atorvastatin, pravastatin, Livalo, Zetia, Repatha, Praluent, and fenofibrate (myalgias with all). Most recently was taking Vascepa and Nexletol. Started experiencing joint and muscle pain. Stopped his Nexletol and sx resolved. Continues on Vascepa but has reduced his dose to 1g BID, had a gout flare and saw that Vascepa could contribute to this. No issues since decreasing his dose. He was previously referred for the ORION-4 trial but did not qualify. His insurance did not cover Leqvio well in 2022 but does now in 2023.   Pt presented to clinic today in good spirits for 3rd Leqvio injection. Reported no issues tolerating Leqvio. He stopped taking his Vascepa altogether after experiencing 2 gout flares and has not had any since. He is changing to [facility] in January and signed up for a Part D  plan as well as supplement that should cover Leqvio well. He has cut back on his sugar/candy intake quite a bit and has been active with his job. Reports home weight of 192 lbs and wants to lose another 5. Also would like lipids checked today out of curiosity since it's been 6 months since his last Leqvio injection. Not fasting - had fruit and a muffin for breakfast and a turkey/cheese sandwich for lunch.  Current Medications: Leqvio 284 mg Q6M  Intolerances:  Rosuvastatin '10mg'$  every other day - myalgias Simvastatin - myalgias Atorvastatin - myalgias Pravastatin - myalgias Livalo - myalgias Zetia - myalgias Fenofibrate - myalgias Praluent - myalgias Repatha - myalgias Nexletol - myalgias and joint pain Vascepa - gout  Risk Factors: premature ASCVD  LDL goal: '55mg'$ /dL  Diet: reduced sugar considerably. Eats fish and chicken for protein as well as nuts.  Family History: Hyperlipidemia in his mother, pulmonary fibrosis and CABG x7 in his father. 4 uncles on his mother's side died in their 61s from heart disease.  Social History: The patient  reports that he has never smoked. He has never used smokeless tobacco. He reports current alcohol use. He reports that he does not use drugs  Labs: 11/08/21: TC 147, TG 112, HDL 52, LDL 75 (Leqvio '284mg'$  Q6M, Vascepa 2g BID) 05/21/2021: TC 213, TG 165, HDL 49, LDL 135 (on Vascepa 2g daily) 03/08/2019: TC 261,  TG 239, HDL 43, LDL 173 (no LLT) 05/14/2017: TC 275, TG 183, HDL 38, LDL 200 (no LLT)   Past Medical History:  Diagnosis Date   BCC (basal cell carcinoma), ear    CAD in native artery 03/29/15   severe CAD    History of kidney stones    Hyperlipidemia    Pneumonia    as infant   S/P CABG x 4 03/31/15    Current Outpatient Medications on File Prior to Visit  Medication Sig Dispense Refill   aspirin 81 MG EC tablet Take 1 tablet (81 mg total) by mouth daily.     icosapent Ethyl (VASCEPA) 1 g capsule Take 2 capsules (2 g total) by mouth 2  (two) times daily. 360 capsule 3   inclisiran (LEQVIO) 284 MG/1.5ML SOSY injection Inject '284mg'$  subcutaneously every 6 months 1.5 mL 3   nitroGLYCERIN (NITROSTAT) 0.4 MG SL tablet Place 1 tablet under the tongue every 5 minutes as needed for chest pain. 25 tablet 3   No current facility-administered medications on file prior to visit.    Allergies  Allergen Reactions   Peanut-Containing Drug Products Shortness Of Breath and Swelling    migraine   Crestor [Rosuvastatin Calcium] Other (See Comments)    Myalgias   Repatha [Evolocumab] Other (See Comments)    Muscle pain    Zetia [Ezetimibe] Other (See Comments)    Myalgias   Fenofibrate     Muscle pain   Lipitor [Atorvastatin] Other (See Comments)    myalgia   Livalo [Pitavastatin] Other (See Comments)    myalgia   Nexletol [Bempedoic Acid]     Joint pain and myalgias   Praluent [Alirocumab]     Muscle pain   Pravastatin     myalgias   Zocor [Simvastatin] Other (See Comments)    myalgia    Assessment/Plan:  1. Hyperlipidemia - LDL improved to 75 after starting Leqvio injections. Much improved from baseline, does remain above goal < 55 given premature ASCVD. However, do not have other med options to bring his LDL to goal as he is intolerant to 5 statins, Zetia, Nexletol, Praluent, Repatha, fenofibrate, and Vascepa. Third Leqvio injection given today in office into the back of his right upper arm. Next Leqvio injection due in 6 months, he will have Medicare D and a supplement plan at that time so future injections will be given at the infusion center at [facility]. He signed benefit form today which will be submitted in January when his new plan goes into effect to confirm affordability. Also rechecking lipids today per pt request as his last Leqvio dose was given 6 months ago.  Katelinn Justice E. Kassiah Mccrory, PharmD, BCACP, [place] [address] [place], [place] [address] Phone: [phone]; Fax: [phone] 04/03/2022 8:39 AM

## 2022-04-04 ENCOUNTER — Ambulatory Visit: Payer: BC Managed Care – PPO

## 2022-04-04 LAB — LIPID PANEL
Chol/HDL Ratio: 3.6 ratio (ref 0.0–5.0)
Cholesterol, Total: 174 mg/dL (ref 100–199)
HDL: 48 mg/dL (ref >39)
LDL Chol Calc (NIH): 85 mg/dL (ref 0–99)
Triglycerides: 250 mg/dL — ABNORMAL HIGH (ref 0–149)
VLDL Cholesterol Cal: 41 mg/dL — ABNORMAL HIGH (ref 5–40)

## 2022-05-16 NOTE — Progress Notes (Signed)
Cardiology Office Note   Date:  05/17/2022   ID:  MARCK MCCLENNY, DOB 1955-12-30, MRN [id number]  PCP:  Gaynelle Arabian, MD  Cardiologist:   Mertie Moores, MD   Chief Complaint  Patient presents with   Coronary Artery Disease   Hyperlipidemia   Problem list 1. Coronary artery disease-status post coronary artery bypass grafting-December 2 , 2016 Myoview study from May, 2022 shows a fixed inferior scar suggesting a prior inferior MI   2. Hyperlipidemia   Previous notes:   TEODOR PRATER is a 67 y.o. male who presents for  CAD / CABG Doing much better after CABG Has lost 10 lbs. Walks 3-5 miles a day .   No angina   Has been on a very low fat diet. Has tried numerous statins and did not tolerate it at all. Have prescribed crestor  - has not started it yet.    Has tolerated fenofobrate fairly well.   Aug 30, 2015:  Doing great - except he is not tolerating Crestor  Has tried it twice - severe muscle  Has tried zocor, lipitor, crestor, pravachol, Livalo   August 16, 2020:   Jaren is seen today for a work in visit.  He has a history of coronary artery disease and coronary artery bypass grafting. He has been seen in our lipid clinic  I last saw him in May, 2017 ( 5 years ago) ,  He was seen by Ellen Henri, [place] in May , 2020.  Complaining of CP Cath showed progressive diffuse disease Medical therapy was recommended.    He called the office several days ago complaining of elevated blood pressure and increasing shortness of breath.  BP is normal today and was yesterday  Has had more fatigue recently  No short of breath like he did in 2016 upon presentation   Has had some chest pressure ( while lying down) resolved when he sat up  Is feeling much better for the past several days   He walked across our parking lot fast today while coming in and did not have any issues  Has lost quite a bit of weight   November 15, 2020 Dempsey is seen today for follow up for his CAD [name] showed an inferior scar  He is feeling better  No further chest pressure  Occasionally gets some indigestion with exercise  He will try a NTG for this "indigestion" like discomfort .  Jan. 23, 2023 Sedale is seen today for follow up of his CAD , HLD  His cp from last summer have largely resolved.  He remains very active at work  Has some chest pain with mental stress.    Jan. 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   He will work on getting back on his diet.  He will work on some weight loss.  Will have him return in 2 to 3 weeks to recheck his blood pressure.  He will bring his blood pressure log.  Will decide on adding additional medications at that time.  Past Medical History:  Diagnosis Date   BCC (basal cell carcinoma), ear    CAD in native artery 03/29/15   severe CAD    History of kidney stones    Hyperlipidemia  Pneumonia    as infant   S/P CABG x 4 03/31/15    Past Surgical History:  Procedure Laterality Date   CARDIAC CATHETERIZATION N/A 03/29/2015   Procedure: Left Heart Cath and Coronary Angiography;  Surgeon: Jettie Booze, MD;  Location: [facility];  Service: Cardiovascular;  Laterality: N/A;   CATARACT EXTRACTION Left    CORONARY ARTERY BYPASS GRAFT N/A 03/31/2015   Procedure: CORONARY ARTERY BYPASS GRAFTING (CABG) X 4 UTILIZING THE LEFT INTERNAL MAMMARY ARTERY  AND RIGHT SAPHENEOUS VEINS .;  Surgeon: Gaye Pollack, MD;  Location: [facility];  Service: Open Heart Surgery;  Laterality: N/A;   EXTRACORPOREAL SHOCK WAVE LITHOTRIPSY Right 01/30/2017   Procedure: RIGHT EXTRACORPOREAL SHOCK WAVE LITHOTRIPSY (ESWL);  Surgeon: Kathie Rhodes, MD;  Location: [facility];  Service: Urology;  Laterality: Right;   HYDROCELE EXCISION     LEFT HEART CATH AND CORS/GRAFTS  ANGIOGRAPHY N/A 09/29/2018   Procedure: LEFT HEART CATH AND CORS/GRAFTS ANGIOGRAPHY;  Surgeon: Troy Sine, MD;  Location: [facility];  Service: Cardiovascular;  Laterality: N/A;   MANDIBLE SURGERY     TEE WITHOUT CARDIOVERSION N/A 03/31/2015   Procedure: TRANSESOPHAGEAL ECHOCARDIOGRAM (TEE);  Surgeon: Gaye Pollack, MD;  Location: [facility];  Service: Open Heart Surgery;  Laterality: N/A;     Current Outpatient Medications  Medication Sig Dispense Refill   clopidogrel (PLAVIX) 75 MG tablet Take 1 tablet (75 mg total) by mouth daily. 90 tablet 3   inclisiran (LEQVIO) 284 MG/1.5ML SOSY injection Inject '284mg'$  subcutaneously every 6 months 1.5 mL 3   nitroGLYCERIN (NITROSTAT) 0.4 MG SL tablet Dissolve 1 tablet under the tongue every 5 minutes as needed for chest pain. Max of 3 doses, then 911. 25 tablet 6   No current facility-administered medications for this visit.    Allergies:   Peanut-containing drug products, Crestor [rosuvastatin calcium], Repatha [evolocumab], Zetia [ezetimibe], Fenofibrate, Icosapent ethyl, Lipitor [atorvastatin], Livalo [pitavastatin], Nexletol [bempedoic acid], Praluent [alirocumab], Pravastatin, and Zocor [simvastatin]    Social History:  The patient  reports that he has never smoked. He has never used smokeless tobacco. He reports current alcohol use. He reports that he does not use drugs.   Family History:  The patient's family history includes Hyperlipidemia in his mother; Pulmonary fibrosis in his father.    ROS:  Please see the history of present illness.    Physical Exam: Blood pressure (!) 145/85, pulse (!) 54, height '6\' 1"'$  (1.854 m), weight 200 lb 3.2 oz (90.8 kg), SpO2 97 %.  HYPERTENSION CONTROL Vitals:   05/17/22 1024 05/17/22 1049  BP: (!) 166/80 (!) 145/85    The patient's blood pressure is elevated above target today.  In order to address the patient's elevated BP: Blood pressure will be monitored at home to determine if medication  changes need to be made.       GEN:  Well nourished, well developed in no acute distress HEENT: Normal NECK: No JVD; No carotid bruits LYMPHATICS: No lymphadenopathy CARDIAC: RRR , no murmurs, rubs, gallops RESPIRATORY:  Clear to auscultation without rales, wheezing or rhonchi  ABDOMEN: Soft, non-tender, non-distended MUSCULOSKELETAL:  No edema; No deformity  SKIN: Warm and dry NEUROLOGIC:  Alert and oriented x 3   ECG : May 17, 2022: Sinus bradycardia at 54.  First-degree AV block.  Old inferior wall myocardial infarction.    Recent Labs: 05/21/2021: BUN 15; Creatinine, Ser 1.11; Potassium 4.5; Sodium 139 11/08/2021: ALT 10    Lipid Panel  Component Value Date/Time   CHOL 174 04/03/2022 1324   TRIG 250 (H) 04/03/2022 1324   HDL 48 04/03/2022 1324   CHOLHDL 3.6 04/03/2022 1324   CHOLHDL 4.5 10/04/2015 0812   VLDL 31 (H) 10/04/2015 0812   LDLCALC 85 04/03/2022 1324      Wt Readings from Last 3 Encounters:  05/17/22 200 lb 3.2 oz (90.8 kg)  05/21/21 198 lb (89.8 kg)  11/15/20 199 lb (90.3 kg)      Other studies Reviewed: Additional studies/ records that were reviewed today include: . Review of the above records demonstrates:    ASSESSMENT AND PLAN:  1.  Coronary artery disease:   no recent episodes of angina   His Myoview shows a fixed inferior scar c/w a previous inferior MI       2. Hyperlipidemia:  LDL looks better .  85 on Inclisiran .  Cont Inclisiran.   He is intolerant to statins, PCSK9 inhibotors   3.  Hypertension:   He will work on getting back on his diet.  He will work on some weight loss.  Will have him return in 2 to 3 weeks to recheck his blood pressure.  He will bring his blood pressure log.  Will decide on adding additional medications at that time.   Current medicines are reviewed at length with the patient today.  The patient does not have concerns regarding medicines.  The following changes have been made:  no change  Labs/  tests ordered today include:   Orders Placed This Encounter  Procedures   EKG 12-Lead      Disposition:     Mertie Moores, MD  05/17/2022 11:04 AM    [facility] [place], [place], [place]  [address] Phone: [phone]; Fax: [phone]

## 2022-05-17 ENCOUNTER — Ambulatory Visit: Payer: Medicare Other | Attending: Cardiovascular Disease | Admitting: Cardiovascular Disease

## 2022-05-17 ENCOUNTER — Encounter: Payer: Self-pay | Admitting: Cardiovascular Disease

## 2022-05-17 VITALS — BP 145/85 | HR 54 | Ht 73.0 in | Wt 200.2 lb

## 2022-05-17 DIAGNOSIS — I10 Essential (primary) hypertension: Secondary | ICD-10-CM

## 2022-05-17 DIAGNOSIS — I2510 Atherosclerotic heart disease of native coronary artery without angina pectoris: Secondary | ICD-10-CM | POA: Diagnosis not present

## 2022-05-17 DIAGNOSIS — E785 Hyperlipidemia, unspecified: Secondary | ICD-10-CM | POA: Diagnosis not present

## 2022-05-17 MED ORDER — CLOPIDOGREL BISULFATE 75 MG PO TABS: 75.0000 mg | ORAL_TABLET | Freq: Every day | ORAL | 3 refills | Status: DC

## 2022-05-17 MED ORDER — NITROGLYCERIN 0.4 MG SL SUBL: SUBLINGUAL_TABLET | SUBLINGUAL | 6 refills | Status: AC

## 2022-05-17 NOTE — Patient Instructions (Signed)
Medication Instructions:  STOP Aspirin START Plavix (Clopidogrel)'75mg'$  daily *If you need a refill on your cardiac medications before your next appointment, please call your pharmacy*   Lab Work: NONE If you have labs (blood work) drawn today and your tests are completely normal, you will receive your results only by: [place] (if you have MyChart) OR A paper copy in the mail If you have any lab test that is abnormal or we need to change your treatment, we will call you to review the results.   Testing/Procedures: NONE   Follow-Up: At [facility], you and your health needs are our priority.  As part of our continuing mission to provide you with exceptional heart care, we have created designated Provider Care Teams.  These Care Teams include your primary Cardiologist (physician) and Advanced Practice Providers (APPs -  Physician Assistants and Nurse Practitioners) who all work together to provide you with the care you need, when you need it.  We recommend signing up for the patient portal called "MyChart".  Sign up information is provided on this After Visit Summary.  MyChart is used to connect with patients for Virtual Visits (Telemedicine).  Patients are able to view lab/test results, encounter notes, upcoming appointments, etc.  Non-urgent messages can be sent to your provider as well.   To learn more about what you can do with MyChart, go to [email or web address].    Your next appointment:   2 month(s)  Provider:   Mertie Moores, MD

## 2022-05-20 ENCOUNTER — Telehealth: Payer: Self-pay | Admitting: Pharmacist

## 2022-05-20 NOTE — Telephone Encounter (Signed)
Called Leqvio service portal to follow up on coverage since he changed to Medicare. Portal states that they were reaching out to pt for his insurance info. Advised them they already have insurance info because I faxed it in with his request...apparently they never updated this and will do so now and then rerun benefits.

## 2022-05-22 ENCOUNTER — Other Ambulatory Visit: Payer: Self-pay | Admitting: Pharmacist

## 2022-05-22 ENCOUNTER — Encounter: Payer: Self-pay | Admitting: Pharmacist

## 2022-05-22 DIAGNOSIS — I2510 Atherosclerotic heart disease of native coronary artery without angina pectoris: Secondary | ICD-10-CM

## 2022-05-22 NOTE — Telephone Encounter (Signed)
Received fax from [facility] service center that pt's Kenneth Vance is covered at 100% once he meets his annual $240 Medicare deductible. He is due for his next Leqvio injection in June. This has been scheduled at [facility], sent pt mychart message with instructions on location since this will be his first injection at the hospital.

## 2022-07-29 ENCOUNTER — Encounter: Payer: Self-pay | Admitting: Cardiovascular Disease

## 2022-07-29 NOTE — Progress Notes (Unsigned)
Cardiology Office Note   Date:  07/30/2022   ID:  Kenneth Vance, Kenneth Vance July 14, 1955, MRN [id number]  PCP:  Gaynelle Arabian, MD  Cardiologist:   Mertie Moores, MD   Chief Complaint  Patient presents with   Coronary Artery Disease        Hyperlipidemia   Problem list 1. Coronary artery disease-status post coronary artery bypass grafting-December 2 , 2016 Myoview study from May, 2022 shows a fixed inferior scar suggesting a prior inferior MI   2. Hyperlipidemia   Previous notes:   Kenneth Vance is a 67 y.o. male who presents for  CAD / CABG Doing much better after CABG Has lost 10 lbs. Walks 3-5 miles a day .   No angina   Has been on a very low fat diet. Has tried numerous statins and did not tolerate it at all. Have prescribed crestor  - has not started it yet.    Has tolerated fenofobrate fairly well.   Aug 30, 2015:  Doing great - except he is not tolerating Crestor  Has tried it twice - severe muscle  Has tried zocor, lipitor, crestor, pravachol, Livalo   August 16, 2020:   Kenneth Vance is seen today for a work in visit.  He has a history of coronary artery disease and coronary artery bypass grafting. He has been seen in our lipid clinic  I last saw him in May, 2017 ( 5 years ago) ,  He was seen by Ellen Henri, F[place] in May , 2020.  Complaining of CP Cath showed progressive diffuse disease Medical therapy was recommended.    He called the office several days ago complaining of elevated blood pressure and increasing shortness of breath.  BP is normal today and was yesterday  Has had more fatigue recently  No short of breath like he did in 2016 upon presentation   Has had some chest pressure ( while lying down) resolved when he sat up  Is feeling much better for the past several days   He walked across our parking lot fast today while coming in and did not have any issues  Has lost quite a bit of weight   November 15, 2020 Kenneth Vance is seen today for follow up for his  CAD [name] showed an inferior scar  He is feeling better  No further chest pressure  Occasionally gets some indigestion with exercise  He will try a NTG for this "indigestion" like discomfort .  Jan. 23, 2023 Kenneth Vance is seen today for follow up of his CAD , HLD  His cp from last summer have largely resolved.  He remains very active at work  Has some chest pain with mental stress.    Jan. 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   He will work on getting back on his diet.  He will work on some weight loss.  Will have him return in 2 to 3 weeks to recheck his blood pressure.  He will bring his blood pressure log.  Will decide on adding additional medications at that time.  July 30, 2022 Kenneth Vance is seen for follow up of his CAD, HLD Is intolerent to statins Repatha, pralulent Doing well on Chestnut BP log looks ok On Inclisiran and ASA  Past Medical History:  Diagnosis Date   BCC (basal cell carcinoma), ear    CAD in native artery 03/29/15   severe CAD    History of kidney stones    Hyperlipidemia    Pneumonia    as infant   S/P CABG x 4 03/31/15    Past Surgical History:  Procedure Laterality Date   CARDIAC CATHETERIZATION N/A 03/29/2015   Procedure: Left Heart Cath and Coronary Angiography;  Surgeon: Jettie Booze, MD;  Location: [facility];  Service: Cardiovascular;  Laterality: N/A;   CATARACT EXTRACTION Left    CORONARY ARTERY BYPASS GRAFT N/A 03/31/2015   Procedure: CORONARY ARTERY BYPASS GRAFTING (CABG) X 4 UTILIZING THE LEFT INTERNAL MAMMARY ARTERY  AND RIGHT SAPHENEOUS VEINS .;  Surgeon: Gaye Pollack, MD;  Location: [facility];  Service: Open Heart Surgery;  Laterality: N/A;   EXTRACORPOREAL SHOCK WAVE LITHOTRIPSY Right 01/30/2017   Procedure: RIGHT  EXTRACORPOREAL SHOCK WAVE LITHOTRIPSY (ESWL);  Surgeon: Kathie Rhodes, MD;  Location: [facility];  Service: Urology;  Laterality: Right;   HYDROCELE EXCISION     LEFT HEART CATH AND CORS/GRAFTS ANGIOGRAPHY N/A 09/29/2018   Procedure: LEFT HEART CATH AND CORS/GRAFTS ANGIOGRAPHY;  Surgeon: Troy Sine, MD;  Location: [facility];  Service: Cardiovascular;  Laterality: N/A;   MANDIBLE SURGERY     TEE WITHOUT CARDIOVERSION N/A 03/31/2015   Procedure: TRANSESOPHAGEAL ECHOCARDIOGRAM (TEE);  Surgeon: Gaye Pollack, MD;  Location: [facility];  Service: Open Heart Surgery;  Laterality: N/A;     Current Outpatient Medications  Medication Sig Dispense Refill   aspirin EC 81 MG tablet Take 81 mg by mouth daily. Swallow whole.     inclisiran (LEQVIO) 284 MG/1.5ML SOSY injection Inject 284mg  subcutaneously every 6 months 1.5 mL 3   nitroGLYCERIN (NITROSTAT) 0.4 MG SL tablet Dissolve 1 tablet under the tongue every 5 minutes as needed for chest pain. Max of 3 doses, then 911. 25 tablet 6   clopidogrel (PLAVIX) 75 MG tablet Take 1 tablet (75 mg total) by mouth daily. (Patient not taking: Reported on 07/30/2022) 90 tablet 3   No current facility-administered medications for this visit.    Allergies:   Peanut-containing drug products, Crestor [rosuvastatin calcium], Repatha [evolocumab], Zetia [ezetimibe], Fenofibrate, Icosapent ethyl, Lipitor [atorvastatin], Livalo [pitavastatin], Nexletol [bempedoic acid], Praluent [alirocumab], Pravastatin, and Zocor [simvastatin]    Social History:  The patient  reports that he has never smoked. He has never used smokeless tobacco. He reports current alcohol use. He reports that he does not use drugs.   Family History:  The patient's family history includes Hyperlipidemia in his mother; Pulmonary fibrosis in his father.    ROS:  Please see the history of present illness.    Physical Exam: Blood pressure 135/60, pulse 73, height 6\' 1"  (1.854 m), weight 203 lb 3.2 oz  (92.2 kg), SpO2 97 %.       GEN:  Well nourished, well developed in no acute distress HEENT: Normal NECK: No JVD; No carotid bruits LYMPHATICS: No lymphadenopathy CARDIAC: RRR , no murmurs, rubs, gallops RESPIRATORY:  Clear to auscultation without rales, wheezing or rhonchi  ABDOMEN: Soft, non-tender, non-distended MUSCULOSKELETAL:  No edema; No deformity  SKIN: Warm and dry NEUROLOGIC:  Alert and oriented x 3    ECG :      Recent Labs: 11/08/2021: ALT 10    Lipid Panel    Component Value Date/Time   CHOL 174 04/03/2022 1324   TRIG 250 (H)  04/03/2022 1324   HDL 48 04/03/2022 1324   CHOLHDL 3.6 04/03/2022 1324   CHOLHDL 4.5 10/04/2015 0812   VLDL 31 (H) 10/04/2015 0812   LDLCALC 85 04/03/2022 1324      Wt Readings from Last 3 Encounters:  07/30/22 203 lb 3.2 oz (92.2 kg)  05/17/22 200 lb 3.2 oz (90.8 kg)  05/21/21 198 lb (89.8 kg)      Other studies Reviewed: Additional studies/ records that were reviewed today include: . Review of the above records demonstrates:    ASSESSMENT AND PLAN:  1.  Coronary artery disease:   no recent episodes of angina   His Myoview shows a fixed inferior scar c/w a previous inferior MI        2. Hyperlipidemia:  well controlled on his Inclisiran  Will discuss the possiblity of adding Bempedoic acid to his regimine.  Otherwise, cont same meds.  We discussed the benefit of a 10 lb weight loss . He will continue to work on it    3.  Hypertension:    BP looks good .      Current medicines are reviewed at length with the patient today.  The patient does not have concerns regarding medicines.  The following changes have been made:  no change  Labs/ tests ordered today include:   No orders of the defined types were placed in this encounter.     Disposition:     Mertie Moores, MD  07/30/2022 3:25 PM    [facility] [place], [place], [place]  [address] Phone: [phone]; Fax: [phone]

## 2022-07-30 ENCOUNTER — Encounter: Payer: Self-pay | Admitting: Cardiovascular Disease

## 2022-07-30 ENCOUNTER — Ambulatory Visit: Payer: Medicare Other | Attending: Cardiovascular Disease | Admitting: Cardiovascular Disease

## 2022-07-30 VITALS — BP 135/60 | HR 73 | Ht 73.0 in | Wt 203.2 lb

## 2022-07-30 DIAGNOSIS — E785 Hyperlipidemia, unspecified: Secondary | ICD-10-CM | POA: Insufficient documentation

## 2022-07-30 DIAGNOSIS — I2510 Atherosclerotic heart disease of native coronary artery without angina pectoris: Secondary | ICD-10-CM

## 2022-07-30 DIAGNOSIS — I10 Essential (primary) hypertension: Secondary | ICD-10-CM | POA: Diagnosis present

## 2022-07-30 NOTE — Patient Instructions (Signed)
Medication Instructions:  Your physician recommends that you continue on your current medications as directed. Please refer to the Current Medication list given to you today.  *If you need a refill on your cardiac medications before your next appointment, please call your pharmacy*     Follow-Up: At [facility], you and your health needs are our priority.  As part of our continuing mission to provide you with exceptional heart care, we have created designated Provider Care Teams.  These Care Teams include your primary Cardiologist (physician) and Advanced Practice Providers (APPs -  Physician Assistants and Nurse Practitioners) who all work together to provide you with the care you need, when you need it.   Your next appointment:   1 year(s)  Provider:   Philip Nahser, MD     

## 2022-09-06 ENCOUNTER — Telehealth: Payer: Self-pay | Admitting: Pharmacist

## 2022-09-06 ENCOUNTER — Other Ambulatory Visit: Payer: Self-pay | Admitting: Pharmacist

## 2022-09-06 NOTE — Telephone Encounter (Signed)
Calling pt to let him know that we are transitioning patients from the hosptial to the [facility] infusion center for [employer]. He is schedule for injection in June. Will need to cancel apt at hospital. Referral is in and someone from [facility] should be reaching out to him to schedule. Spoke with patient and he is in agreement with the plan.

## 2022-09-16 ENCOUNTER — Telehealth: Payer: Self-pay | Admitting: Pharmacy Technician

## 2022-09-16 NOTE — Telephone Encounter (Signed)
Kenneth Vance note:  Auth Submission: NO AUTH NEEDED Site of care: Site of care: [facility] Payer: medicar a/b & aarp Medication & CPT/J Code(s) submitted: Leqvio (Inclisiran) [phone] Route of submission (phone, fax, portal):  Phone # Fax # Auth type: Buy/Bill Units/visits requested: 2 Reference number: leqvio service center - [facility] Approval from: 09/16/22 to 04/29/23   Patient will be scheduled as soon as possible

## 2022-09-20 ENCOUNTER — Telehealth: Payer: Self-pay | Admitting: Cardiovascular Disease

## 2022-09-20 NOTE — Telephone Encounter (Signed)
He has [employer] now and is getting med from [facility] infusion center, specialty pharmacy will no longer be filling and prior auth isn't needed with his Medicare/supplement plan. I will fax this info to [facility].

## 2022-09-20 NOTE — Telephone Encounter (Signed)
Pt c/o medication issue:  1. Name of Medication: inclisiran (LEQVIO) 284 MG/1.5ML SOSY injection   2. How are you currently taking this medication (dosage and times per day)?  Inject 284mg  subcutaneously every 6 months  3. Are you having a reaction (difficulty breathing--STAT)? No   4. What is your medication issue? [facility] need a per-auth for patient to get the [facility]

## 2022-09-25 ENCOUNTER — Ambulatory Visit: Payer: Medicare Other

## 2022-09-30 ENCOUNTER — Encounter (HOSPITAL_COMMUNITY): Payer: Medicare Other

## 2022-10-03 ENCOUNTER — Ambulatory Visit (INDEPENDENT_AMBULATORY_CARE_PROVIDER_SITE_OTHER): Payer: Medicare Other

## 2022-10-03 VITALS — BP 138/79 | HR 56 | Temp 97.8°F | Resp 18 | Ht 73.0 in | Wt 202.0 lb

## 2022-10-03 DIAGNOSIS — I2510 Atherosclerotic heart disease of native coronary artery without angina pectoris: Secondary | ICD-10-CM

## 2022-10-03 DIAGNOSIS — E785 Hyperlipidemia, unspecified: Secondary | ICD-10-CM

## 2022-10-03 MED ORDER — INCLISIRAN SODIUM 284 MG/1.5ML ~~LOC~~ SOSY
284.0000 mg | PREFILLED_SYRINGE | Freq: Once | SUBCUTANEOUS | Status: AC
  Administered 2022-10-03: 284 mg via SUBCUTANEOUS
  Filled 2022-10-03: qty 1.5

## 2022-10-03 NOTE — Progress Notes (Signed)
Diagnosis: Hyperlipidemia  Provider:  Chilton Greathouse MD  Procedure: Injection  Leqvio (inclisiran), Dose: 284 mg, Site: subcutaneous, Number of injections: 1  Left arm  Post Care: Patient declined observation  Discharge: Condition: Good, Destination: Home . AVS Provided  Performed by:  Marlow Baars [place], RN

## 2023-04-04 ENCOUNTER — Ambulatory Visit (INDEPENDENT_AMBULATORY_CARE_PROVIDER_SITE_OTHER): Payer: Medicare Other | Admitting: *Deleted

## 2023-04-04 ENCOUNTER — Ambulatory Visit: Payer: Medicare Other

## 2023-04-04 VITALS — BP 136/79 | HR 59 | Temp 97.6°F | Resp 18 | Ht 73.0 in | Wt 201.8 lb

## 2023-04-04 DIAGNOSIS — E785 Hyperlipidemia, unspecified: Secondary | ICD-10-CM

## 2023-04-04 DIAGNOSIS — I2510 Atherosclerotic heart disease of native coronary artery without angina pectoris: Secondary | ICD-10-CM

## 2023-04-04 MED ORDER — INCLISIRAN SODIUM 284 MG/1.5ML ~~LOC~~ SOSY
284.0000 mg | PREFILLED_SYRINGE | Freq: Once | SUBCUTANEOUS | Status: AC
  Administered 2023-04-04: 284 mg via SUBCUTANEOUS
  Filled 2023-04-04: qty 1.5

## 2023-04-04 NOTE — Progress Notes (Signed)
Diagnosis: Hyperlipidemia  Provider:  Chilton Greathouse MD  Procedure: Injection  Leqvio (inclisiran), Dose: 284 mg, Site: subcutaneous, Number of injections: 1  Injection Site(s): Right arm  Post Care: Observation period completed  Discharge: Condition: Good, Destination: Home . AVS Declined  Performed by:  Forrest Moron, RN

## 2023-06-13 ENCOUNTER — Telehealth: Payer: Self-pay | Admitting: Pharmacy Technician

## 2023-06-13 NOTE — Telephone Encounter (Signed)
Auth Submission: NO AUTH NEEDED Site of care: Site of care: [facility] Payer: MEDICARE A/B Medication & CPT/J Code(s) submitted: Leqvio (Inclisiran) [phone] Route of submission (phone, fax, portal):  Phone # Fax # Auth type: Buy/Bill PB Units/visits requested: 2 DOSES Reference number:  Approval from: 05/30/23 to 05/29/24    [employer]: [employer] aware.  AARP supp in-active

## 2023-09-26 ENCOUNTER — Encounter: Payer: Self-pay | Admitting: Cardiovascular Disease

## 2023-10-03 ENCOUNTER — Ambulatory Visit: Payer: Medicare Other

## 2023-10-03 ENCOUNTER — Telehealth: Payer: Self-pay

## 2023-10-03 ENCOUNTER — Encounter: Payer: Self-pay | Admitting: Cardiovascular Disease

## 2023-10-03 VITALS — BP 128/77 | HR 61 | Temp 98.5°F | Resp 16 | Ht 73.0 in | Wt 204.0 lb

## 2023-10-03 DIAGNOSIS — E785 Hyperlipidemia, unspecified: Secondary | ICD-10-CM | POA: Diagnosis not present

## 2023-10-03 DIAGNOSIS — I2510 Atherosclerotic heart disease of native coronary artery without angina pectoris: Secondary | ICD-10-CM

## 2023-10-03 MED ORDER — INCLISIRAN SODIUM 284 MG/1.5ML ~~LOC~~ SOSY
284.0000 mg | PREFILLED_SYRINGE | Freq: Once | SUBCUTANEOUS | Status: AC
  Administered 2023-10-03: 284 mg via SUBCUTANEOUS
  Filled 2023-10-03: qty 1.5

## 2023-10-03 NOTE — Progress Notes (Signed)
 Diagnosis: Hyperlipidemia  Provider:  Chilton Greathouse MD  Procedure: Injection  Leqvio (inclisiran), Dose: 284 mg, Site: subcutaneous, Number of injections: 1  Injection Site(s): Right arm  Post Care:  right arm injection  Discharge: Condition: Good, Destination: Home . AVS Declined  Performed by:  Rico Ala, LPN

## 2023-10-03 NOTE — Telephone Encounter (Signed)
 Auth Submission: APPROVED Site of care: Site of care: [facility] Payer: [employer] Medication & CPT/J Code(s) submitted: Leqvio  (Inclisiran) J1306 Route of submission (phone, fax, portal): fax Phone # Fax # [phone]  Auth type: Buy/Bill PB Units/visits requested: 284mg  x 1 dose Reference number: [id number] Approval from: 10/03/23 to 01/01/24

## 2023-12-05 ENCOUNTER — Telehealth: Payer: Self-pay

## 2023-12-05 NOTE — Telephone Encounter (Signed)
 Auth Submission: APPROVED Site of care: Site of care: [facility] Payer: [employer] advantage Medication & CPT/J Code(s) submitted: Leqvio  (Inclisiran) J1306 Diagnosis Code:  Route of submission (phone, fax, portal): fax Phone # Fax # Auth type: Buy/Bill PB Units/visits requested: 284mg  x 1 dose Reference number: [id number] Approval from: 12/04/23 to 03/03/24

## 2024-03-10 ENCOUNTER — Telehealth: Payer: Self-pay | Admitting: Pharmacy Technician

## 2024-03-12 ENCOUNTER — Encounter: Payer: Self-pay | Admitting: Cardiovascular Disease

## 2024-03-12 NOTE — Telephone Encounter (Signed)
 ERROR

## 2024-04-05 ENCOUNTER — Telehealth: Payer: Self-pay | Admitting: Pharmacy Technician

## 2024-04-05 ENCOUNTER — Other Ambulatory Visit (HOSPITAL_COMMUNITY): Payer: Self-pay | Admitting: Pulmonary Disease

## 2024-04-05 ENCOUNTER — Telehealth (HOSPITAL_COMMUNITY): Payer: Self-pay | Admitting: Pharmacist

## 2024-04-05 ENCOUNTER — Ambulatory Visit (INDEPENDENT_AMBULATORY_CARE_PROVIDER_SITE_OTHER): Payer: Self-pay

## 2024-04-05 VITALS — BP 146/74 | HR 66 | Temp 97.7°F | Resp 18 | Ht 73.0 in | Wt 203.0 lb

## 2024-04-05 DIAGNOSIS — E785 Hyperlipidemia, unspecified: Secondary | ICD-10-CM | POA: Diagnosis not present

## 2024-04-05 DIAGNOSIS — I2510 Atherosclerotic heart disease of native coronary artery without angina pectoris: Secondary | ICD-10-CM | POA: Diagnosis not present

## 2024-04-05 MED ORDER — INCLISIRAN SODIUM 284 MG/1.5ML ~~LOC~~ SOSY
284.0000 mg | PREFILLED_SYRINGE | Freq: Once | SUBCUTANEOUS | Status: AC
  Administered 2024-04-05: 284 mg via SUBCUTANEOUS

## 2024-04-05 NOTE — Telephone Encounter (Signed)
 Patient has not been seen since May 2024 since starting Leqvio . MyChart message sent to pt to contact [facility] to schedule f/u appt  Routing to Melissa Maccia as FYI (OOO)  Sherry Pennant, PharmD, MPH, BCPS, CPP Clinical Pharmacist

## 2024-04-05 NOTE — Telephone Encounter (Signed)
 Auth Submission: PENDING Site of care: Site of care: [facility] Payer: [employer] ADVT Medication & CPT/J Code(s) submitted: Leqvio  (Inclisiran) J1306 Diagnosis Code: E78.5 Route of submission (phone, fax, portal):  Phone # Fax # Auth type: Buy/Bill PB Units/visits requested: 284MG  Q6 MONTHS Reference number:  Approval from:  to

## 2024-04-05 NOTE — Progress Notes (Signed)
 Diagnosis: Hyperlipidemia  Provider:  Chilton Greathouse MD  Procedure: Injection  Leqvio (inclisiran), Dose: 284 mg, Site: subcutaneous, Number of injections: 1  Injection Site(s): Left arm  Post Care: Patient declined observation  Discharge: Condition: Good, Destination: Home . AVS Declined  Performed by:  Adriana Mccallum, RN

## 2024-04-06 ENCOUNTER — Ambulatory Visit

## 2024-04-06 ENCOUNTER — Telehealth: Payer: Self-pay | Admitting: Pharmacy Technician

## 2024-04-06 VITALS — BP 137/68 | HR 50 | Ht 73.0 in | Wt 202.0 lb

## 2024-04-06 DIAGNOSIS — I10 Essential (primary) hypertension: Secondary | ICD-10-CM

## 2024-04-06 DIAGNOSIS — I2510 Atherosclerotic heart disease of native coronary artery without angina pectoris: Secondary | ICD-10-CM

## 2024-04-06 DIAGNOSIS — E785 Hyperlipidemia, unspecified: Secondary | ICD-10-CM

## 2024-04-06 LAB — LIPID PANEL

## 2024-04-06 NOTE — Patient Instructions (Signed)
 Medication Instructions:  Your physician recommends that you continue on your current medications as directed. Please refer to the Current Medication list given to you today.  *If you need a refill on your cardiac medications before your next appointment, please call your pharmacy*  Lab Work: Today-Lpa, Lipids, A1C If you have labs (blood work) drawn today and your tests are completely normal, you will receive your results only by: MyChart Message (if you have MyChart) OR A paper copy in the mail If you have any lab test that is abnormal or we need to change your treatment, we will call you to review the results.   Follow-Up: At [facility], you and your health needs are our priority.  As part of our continuing mission to provide you with exceptional heart care, our providers are all part of one team.  This team includes your primary Cardiologist (physician) and Advanced Practice Providers or APPs (Physician Assistants and Nurse Practitioners) who all work together to provide you with the care you need, when you need it.  Your next appointment:   1 year(s)  Provider:   Joelle VEAR Ren Donley, MD    We recommend signing up for the patient portal called MyChart.  Sign up information is provided on this After Visit Summary.  MyChart is used to connect with patients for Virtual Visits (Telemedicine).  Patients are able to view lab/test results, encounter notes, upcoming appointments, etc.  Non-urgent messages can be sent to your provider as well.   To learn more about what you can do with MyChart, go to [email or web address].   Other Instructions Please log your blood pressure daily

## 2024-04-06 NOTE — Telephone Encounter (Signed)
 Auth Submission: APPROVED Site of care: Site of care: [facility] Payer: [employer] ADVT Medication & CPT/J Code(s) submitted: Leqvio  (Inclisiran) J1306 Diagnosis Code: E78.5 Route of submission (phone, fax, portal):  Phone # Fax # Auth type: Buy/Bill PB Units/visits requested: X1 DOSE Reference number:  Approval from:  to    Auth #[id number] Approved 03/10/24-06/08/24

## 2024-04-06 NOTE — Progress Notes (Signed)
    Cardiology Office Note Date:  04/06/2024  ID:  Kenneth Vance, DOB 1956/03/21, MRN [id number] PCP:  Hugh Charleston, MD (Inactive)  Cardiologist:   Joelle VEAR Ren Donley, MD  Chief Complaint  Patient presents with   Follow-up      Problems CAD s/p CABG x 4 12/16 LHC 6/20 Patent grafts- LIMA-LAD, SVG-OM1, SVG-RI, SVG-RCA (20%) MPI 5/22: small inferobasal scar; no perfusion abnormalities TTE 6/22: 60-65% Statin/Repatha /Praluent  intolerance On Inclisiran M: ASA81, IN, nitrostat    Visits  [facility] 4/24: CAD s/p CABG 12/16    History of Present Illness: Kenneth Vance is a 68 y.o. male who presents for follow up.  Has been doing well since last visit.  He denies any chest pain or dyspnea with exertion.  He has gained some weight over the last year or so in the setting of not doing much exercising.  He does report some intermittent fatigue but nothing similar to his presentation back in 2016.  He has been on inclisiran since 2023 and has received 5 to 6 injections.  His most recent 1 was yesterday.  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  He is thinking about taking a faculty position at [employer].  ROS: Please see the history of present illness. All other systems are reviewed and negative.   PHYSICAL EXAM: VS:  BP 137/68   Pulse (!) 50   Ht 6' 1 (1.854 m)   Wt 202 lb (91.6 kg)   SpO2 98%   BMI 26.65 kg/m  , BMI Body mass index is 26.65 kg/m. GEN: Well nourished, well developed, in no acute distress HEENT: normal Neck: no JVD, carotid bruits, or masses Cardiac: RRR; no murmurs, rubs, or gallops,no edema  Respiratory:  CTAB bilaterally, normal work of breathing GI: soft, nontender, nondistended, + BS Extremities: No LE edema Skin: warm and dry, no rash Neuro:  Strength and sensation are intact  EKG: NSR  Recent Labs: Reviewed  Studies: Reviewed  ASSESSMENT AND PLAN: Kenneth Vance is a 68 y.o. male who presents for follow up. - Appears  to be asymptomatic from CAD standpoint.  Will check lipid panel and hemoglobin A1c.  - BP is elevated here.  He checks it intermittently at home and systolic has been in the 140s.  He thinks is all related to his lifestyle.  I am providing him with a blood pressure log and told him to check his blood pressure 3-4 times a week and work with his PCP on optimizing his blood pressure. - We will see him back in a year.  Signed, Joelle VEAR Ren Donley, MD  04/06/2024 2:18 PM    [facility]

## 2024-04-07 LAB — LIPID PANEL
Cholesterol, Total: 154 mg/dL (ref 100–199)
HDL: 51 mg/dL (ref >39)
LDL CALC COMMENT:: 3 ratio (ref 0.0–5.0)
LDL Chol Calc (NIH): 79 mg/dL (ref 0–99)
Triglycerides: 134 mg/dL (ref 0–149)
VLDL Cholesterol Cal: 24 mg/dL (ref 5–40)

## 2024-04-07 LAB — HEMOGLOBIN A1C
Est. average glucose Bld gHb Est-mCnc: 103 mg/dL
Hgb A1c MFr Bld: 5.2 % (ref 4.8–5.6)

## 2024-04-07 LAB — LIPOPROTEIN A (LPA): Lipoprotein (a): 48 nmol/L (ref <75.0)

## 2024-04-08 ENCOUNTER — Ambulatory Visit: Payer: Self-pay

## 2024-04-08 DIAGNOSIS — I2510 Atherosclerotic heart disease of native coronary artery without angina pectoris: Secondary | ICD-10-CM

## 2024-05-08 ENCOUNTER — Encounter: Payer: Self-pay | Admitting: Pulmonary Disease

## 2024-05-10 ENCOUNTER — Telehealth: Payer: Self-pay

## 2024-05-10 NOTE — Telephone Encounter (Signed)
 Auth Submission: NO AUTH NEEDED Site of care: Site of care: [facility] Payer: [employer] advantage Medication & CPT/J Code(s) submitted: Leqvio  (Inclisiran) J1306 Diagnosis Code:  Route of submission (phone, fax, portal):  Phone # Fax # Auth type: Buy/Bill PB Units/visits requested: 284mg  x 2 doses Reference number:  Approval from: 05/10/24 to 04/28/25

## 2024-10-05 ENCOUNTER — Ambulatory Visit
